# Patient Record
Sex: Male | Born: 1935 | Race: White | Hispanic: No | Marital: Married | Smoking: Current every day smoker
Health system: Southern US, Community
[De-identification: ages and names within clinical notes are randomized; demographics above are authoritative.]

## PROBLEM LIST (undated history)

## (undated) HISTORY — PX: HERNIA REPAIR: SHX51

---

## 1976-12-04 DIAGNOSIS — I639 Cerebral infarction, unspecified: Secondary | ICD-10-CM

## 1976-12-04 HISTORY — DX: Cerebral infarction, unspecified: I63.9

## 2005-10-09 ENCOUNTER — Ambulatory Visit: Payer: Self-pay | Admitting: Unknown Physician Specialty

## 2006-04-09 ENCOUNTER — Ambulatory Visit: Payer: Self-pay | Admitting: Unknown Physician Specialty

## 2007-05-28 ENCOUNTER — Ambulatory Visit: Payer: Self-pay | Admitting: Family Medicine

## 2007-09-17 ENCOUNTER — Ambulatory Visit: Payer: Self-pay | Admitting: Ophthalmology

## 2009-07-20 ENCOUNTER — Ambulatory Visit: Payer: Self-pay | Admitting: Unknown Physician Specialty

## 2011-02-08 ENCOUNTER — Ambulatory Visit: Payer: Self-pay | Admitting: Ophthalmology

## 2011-02-20 ENCOUNTER — Ambulatory Visit: Payer: Self-pay | Admitting: Ophthalmology

## 2012-04-15 ENCOUNTER — Ambulatory Visit: Payer: Self-pay | Admitting: Dermatology

## 2012-04-15 LAB — CBC WITH DIFFERENTIAL/PLATELET
Basophil #: 0.1 10*3/uL (ref 0.0–0.1)
Eosinophil #: 0.3 10*3/uL (ref 0.0–0.7)
Eosinophil %: 3.9 %
HCT: 36.9 % — ABNORMAL LOW (ref 40.0–52.0)
HGB: 12.8 g/dL — ABNORMAL LOW (ref 13.0–18.0)
Lymphocyte #: 2 10*3/uL (ref 1.0–3.6)
Lymphocyte %: 29.8 %
MCH: 33.3 pg (ref 26.0–34.0)
MCHC: 34.6 g/dL (ref 32.0–36.0)
MCV: 97 fL (ref 80–100)
Monocyte #: 0.5 x10 3/mm (ref 0.2–1.0)
Neutrophil %: 58.1 %
RBC: 3.83 10*6/uL — ABNORMAL LOW (ref 4.40–5.90)
WBC: 6.6 10*3/uL (ref 3.8–10.6)

## 2012-06-19 ENCOUNTER — Ambulatory Visit: Payer: Self-pay | Admitting: Family Medicine

## 2013-06-12 ENCOUNTER — Ambulatory Visit: Payer: Self-pay | Admitting: Family Medicine

## 2013-08-20 ENCOUNTER — Inpatient Hospital Stay: Payer: Self-pay | Admitting: Surgery

## 2013-08-20 LAB — COMPREHENSIVE METABOLIC PANEL
Alkaline Phosphatase: 109 U/L (ref 50–136)
BUN: 19 mg/dL — ABNORMAL HIGH (ref 7–18)
Calcium, Total: 9.5 mg/dL (ref 8.5–10.1)
Chloride: 105 mmol/L (ref 98–107)
Co2: 27 mmol/L (ref 21–32)
Creatinine: 1.13 mg/dL (ref 0.60–1.30)
EGFR (African American): >60
Glucose: 178 mg/dL — ABNORMAL HIGH (ref 65–99)
Osmolality: 279 (ref 275–301)
Potassium: 4.2 mmol/L (ref 3.5–5.1)
Total Protein: 7.7 g/dL (ref 6.4–8.2)

## 2013-08-20 LAB — URINALYSIS, COMPLETE
Bacteria: NONE SEEN
Bilirubin,UR: NEGATIVE
Leukocyte Esterase: NEGATIVE
Squamous Epithelial: NONE SEEN
WBC UR: 1 /HPF (ref 0–5)

## 2013-08-20 LAB — CBC
MCH: 32.6 pg (ref 26.0–34.0)
MCHC: 34.3 g/dL (ref 32.0–36.0)
MCV: 95 fL (ref 80–100)
RBC: 4.26 10*6/uL — ABNORMAL LOW (ref 4.40–5.90)
WBC: 14.2 10*3/uL — ABNORMAL HIGH (ref 3.8–10.6)

## 2013-08-20 LAB — LIPASE, BLOOD: Lipase: 74 U/L (ref 73–393)

## 2013-08-22 LAB — PATHOLOGY REPORT

## 2015-03-26 NOTE — H&P (Signed)
PATIENT NAME:  Alex Mclaughlin, Alex Mclaughlin MR#:  [id number] DATE OF BIRTH:  October 11, 1925  DATE OF ADMISSION:  08/20/2013  PRIMARY CARE PHYSICIAN:  Dr. Burnett ShengHedrick  ADMITTING PHYSICIAN:  Dr. Michela PitcherEly  CHIEF COMPLAINT:  Abdominal pain.   BRIEF HISTORY:  Alex Mclaughlin is an 81 year old gentleman seen in the Emergency Room with intermittent abdominal pain over the last several days with increasing symptoms over the last 12 hours. He thought he was having an episode of diverticulitis. He has significant left lower quadrant suprapubic abdominal tenderness. He vomited several times this morning.  He had 1  episode of diarrhea. He presented to the Emergency Room for further evaluation. He was noted to have a slightly elevated white blood cell count. CT scan was performed which demonstrated what appears to be an incarcerated left inguinal hernia with bowel obstruction. There was some enhancement around the bowel consistent with possible strangulation. The surgical service was consulted.   He has had a known left inguinal hernia with some discomfort in that area before, but it has always been reducible. He elected not to proceed with a nonurgent repair. He has had a history of diverticulosis. He had a colonoscopy which did demonstrate significant diverticulosis. He has had no history of hepatitis, yellow jaundice, pancreatitis, peptic ulcer disease or gallbladder disease. There is no previous abdominal surgery. He has no history of cardiac disease, hypertension, diabetes or thyroid problems. He is a long-standing cigarette smoker.   He takes no medications regularly and has no medical allergies.   His son relates he has been having some problem with stool incontinence recently and wonders if these symptoms are related to this current problem.   REVIEW OF SYSTEMS: Otherwise unremarkable other than some mild urinary urgency and difficulty voiding.   FAMILY HISTORY: Noncontributory.   PHYSICAL EXAMINATION: VITAL SIGNS: Blood  pressure 128/64, heart rate is 92 and regular. He is afebrile.  HEENT: Reveals no scleral icterus. No pupillary abnormalities. No facial deformities. He is very hard of hearing. NECK: Supple, nontender,  midline trachea and no adenopathy.  CHEST: Clear with very distant breath sounds. He has normal pulmonary excursion.  CARDIAC: No murmurs or gallops to my ear and seems to be in normal sinus rhythm.  ABDOMEN: Flat with the exception of a large left inguinal hernia which cannot be reduced and is moderately tender. He has active bowel sounds. No other masses are noted. He has no abdominal scar. EXTREMITIES:  Upper and lower extremity exam reveals full range of motion. No deformities and moderate distal pulses.  PSYCHIATRIC: Normal affect, reasonable orientation. Again, very hard of hearing.   Independently reviewed his CT scan. He does appear to have significant obstruction in the left lower quadrant likely related to his incarcerated hernia. White blood cell count is 14,000. Electrolytes are largely remarkable.   ASSESSMENT AND PLAN: This gentleman appears to have incarcerated left inguinal hernia with evidence for incarceration and possible strangulation. His lactic acid is 2.2. In this setting we would recommend urgent surgical intervention. This plan has been discussed with the patient in detail, and he is in agreement. The son is available for the interview.     ____________________________ Quentin Orealph L. Ely III, MD rle:dmm Mclaughlin: 08/20/2013 20:04:59 ET T: 08/20/2013 20:33:08 ET JOB#: [id number]  cc: Carmie Endalph L. Ely III, MD, <Dictator> Rhona LeavensJames F. Burnett ShengHedrick, MD Quentin OreALPH L ELY MD ELECTRONICALLY SIGNED 08/21/2013 0:06

## 2015-03-26 NOTE — Op Note (Signed)
PATIENT NAME:  Alex McmurrayROUTMAN, Ramesses D MR#:  [id number] DATE OF BIRTH:  September 26, 1925  DATE OF PROCEDURE:  08/20/2013  PREOPERATIVE DIAGNOSIS:  Incarcerated left inguinal hernia.  POSTOPERATIVE DIAGNOSIS:  Incarcerated left inguinal hernia.   PROCEDURE PERFORMED:  Left inguinal hernia repair.   ANESTHESIA:  General.  SURGEON:  Quentin Orealph L. Ely, M.D.   OPERATIVE PROCEDURE:  With the patient in supine position after the induction of appropriate general anesthesia, the patient's abdomen was prepped with ChloraPrep and draped with sterile towels.  An incision was made over the left lower quadrant, parallel to the inguinal ligament and carried down through the subcutaneous tissue with Bovie electrocautery.  External oblique fascia was identified and appeared to separate over the sac itself.  There was a large sac with incarcerated loop of bowel obviously visible.  The bowel could not be well-visualized as to ascertain its viability.  The sac was manipulated off the inguinal floor and separated from the cord structures.  In doing so the bowel spontaneously reduced.  The sac was dissected back to its base in the internal ring.  The sac was opened.  There was no bowel contents in the sac.  I was able to put a finger into the peritoneal cavity and could not identify any loop of bowel.  I did not see the piece of bowel that had been incarcerated so the viability of the bowel was still in question.  The sac was suture-ligated at its base and divided.  The sac was dissected back to the internal ring.  The conjoined tendon was then sutured to Poupart's ligament using 2-0 Surgilon.  A relaxing  incision was made.  The external oblique was then closed over the sac using 2-0 Surgilon without difficulty.  The area was infiltrated with 0.25% Marcaine for postoperative pain control.  Scarpa's fascia was reapproximated with a running suture of 3-0 Vicryl and the skin was clipped.  Sterile dressings applied.  The patient was returned to  the recovery room, having tolerated the procedure well.  Sponge, instrument and needle counts were correct x 2 in the Operating Room.     ____________________________ Quentin Orealph L. Ely III, MD rle:ea D: 08/20/2013 21:53:23 ET T: 08/21/2013 01:59:22 ET JOB#: [id number]  cc: Carmie Endalph L. Ely III, MD, <Dictator> Barbette ReichmannVishwanath Hande, MD Quentin OreALPH L ELY MD ELECTRONICALLY SIGNED 08/25/2013 78:2920:04

## 2015-12-05 DIAGNOSIS — Z8719 Personal history of other diseases of the digestive system: Secondary | ICD-10-CM

## 2015-12-05 DIAGNOSIS — Z9889 Other specified postprocedural states: Secondary | ICD-10-CM

## 2015-12-05 HISTORY — DX: Other specified postprocedural states: Z98.890

## 2015-12-05 HISTORY — DX: Personal history of other diseases of the digestive system: Z87.19

## 2016-02-03 ENCOUNTER — Other Ambulatory Visit: Payer: Self-pay | Admitting: Nurse Practitioner

## 2016-02-03 DIAGNOSIS — R1310 Dysphagia, unspecified: Secondary | ICD-10-CM

## 2016-02-03 DIAGNOSIS — R142 Eructation: Secondary | ICD-10-CM

## 2016-02-11 ENCOUNTER — Ambulatory Visit
Admission: RE | Admit: 2016-02-11 | Discharge: 2016-02-11 | Disposition: A | Discharge status: Home / Self Care | Payer: Medicare Other | Source: Ambulatory Visit | Attending: Nurse Practitioner | Admitting: Nurse Practitioner

## 2016-02-11 ENCOUNTER — Other Ambulatory Visit: Payer: Self-pay | Admitting: Nurse Practitioner

## 2016-02-11 DIAGNOSIS — R1310 Dysphagia, unspecified: Secondary | ICD-10-CM | POA: Insufficient documentation

## 2016-02-11 DIAGNOSIS — M2578 Osteophyte, vertebrae: Secondary | ICD-10-CM | POA: Diagnosis not present

## 2016-02-11 DIAGNOSIS — R062 Wheezing: Secondary | ICD-10-CM

## 2016-02-11 DIAGNOSIS — R142 Eructation: Secondary | ICD-10-CM

## 2016-02-16 ENCOUNTER — Other Ambulatory Visit: Payer: Self-pay | Admitting: Family Medicine

## 2016-02-16 DIAGNOSIS — R911 Solitary pulmonary nodule: Secondary | ICD-10-CM

## 2016-02-24 ENCOUNTER — Ambulatory Visit
Admission: RE | Admit: 2016-02-24 | Discharge: 2016-02-24 | Disposition: A | Discharge status: Home / Self Care | Payer: Medicare Other | Source: Ambulatory Visit | Attending: Family Medicine | Admitting: Family Medicine

## 2016-02-24 DIAGNOSIS — I2510 Atherosclerotic heart disease of native coronary artery without angina pectoris: Secondary | ICD-10-CM | POA: Insufficient documentation

## 2016-02-24 DIAGNOSIS — R911 Solitary pulmonary nodule: Secondary | ICD-10-CM

## 2016-02-24 DIAGNOSIS — R918 Other nonspecific abnormal finding of lung field: Secondary | ICD-10-CM | POA: Diagnosis not present

## 2017-09-07 ENCOUNTER — Emergency Department
Admission: EM | Admit: 2017-09-07 | Discharge: 2017-09-07 | Disposition: A | Discharge status: Home / Self Care | Payer: Medicare Other | Attending: Emergency Medicine | Admitting: Emergency Medicine

## 2017-09-07 ENCOUNTER — Emergency Department: Payer: Medicare Other

## 2017-09-07 DIAGNOSIS — Y9301 Activity, walking, marching and hiking: Secondary | ICD-10-CM | POA: Insufficient documentation

## 2017-09-07 DIAGNOSIS — Y999 Unspecified external cause status: Secondary | ICD-10-CM | POA: Diagnosis not present

## 2017-09-07 DIAGNOSIS — W19XXXA Unspecified fall, initial encounter: Secondary | ICD-10-CM

## 2017-09-07 DIAGNOSIS — Y92009 Unspecified place in unspecified non-institutional (private) residence as the place of occurrence of the external cause: Secondary | ICD-10-CM

## 2017-09-07 DIAGNOSIS — W0110XA Fall on same level from slipping, tripping and stumbling with subsequent striking against unspecified object, initial encounter: Secondary | ICD-10-CM | POA: Diagnosis not present

## 2017-09-07 DIAGNOSIS — S0181XA Laceration without foreign body of other part of head, initial encounter: Secondary | ICD-10-CM | POA: Diagnosis not present

## 2017-09-07 DIAGNOSIS — Y929 Unspecified place or not applicable: Secondary | ICD-10-CM | POA: Insufficient documentation

## 2017-09-07 DIAGNOSIS — S0081XA Abrasion of other part of head, initial encounter: Secondary | ICD-10-CM

## 2017-09-07 DIAGNOSIS — S0990XA Unspecified injury of head, initial encounter: Secondary | ICD-10-CM | POA: Diagnosis present

## 2017-09-07 MED ORDER — BACITRACIN ZINC 500 UNIT/GM EX OINT
TOPICAL_OINTMENT | Freq: Once | CUTANEOUS | Status: AC
  Administered 2017-09-07: 1 via TOPICAL
  Filled 2017-09-07: qty 0.9

## 2017-09-07 NOTE — ED Provider Notes (Signed)
[facility] Emergency Department Provider Note ____________________________________________  Time seen: 2133  I have reviewed the triage vital signs and the nursing notes.  HISTORY  Chief Complaint  Fall  HPI Alex Mclaughlin is a 83 y.o. male Presents to the ED by family member, for evaluation of injury sustained after he tripped at home. The patient apparently was walking along the asphalt, and when he went under her rope, he landed hitting his face on the asphalt. He sustained an abrasion to his nose and forehead, as well as a laceration to his left cheek. Bleeding is currently controlled. Patient denies any loss of consciousness, nausea, vomiting, dizziness, or headache. He denies any particular pain related to his injuries. He does not take any blood thinners or daily aspirin.  No past medical history on file.  There are no active problems to display for this patient.   No past surgical history on file.  Prior to Admission medications   Not on File    Allergies Patient has no known allergies.  No family history on file.  Social History Social History  Substance Use Topics  . Smoking status: Not on file  . Smokeless tobacco: Not on file  . Alcohol use Not on file    Review of Systems  Constitutional: Negative for fever. Eyes: Negative for visual changes. ENT: Negative for sore throat. Cardiovascular: Negative for chest pain. Respiratory: Negative for shortness of breath. Gastrointestinal: Negative for abdominal pain, vomiting and diarrhea. Musculoskeletal: Negative for back pain. Skin: Negative for rash. Facial abrasion and lacerations as above. Neurological: Negative for headaches, focal weakness or numbness. ____________________________________________  PHYSICAL EXAM:  VITAL SIGNS: ED Triage Vitals  Enc Vitals Group     BP 09/07/17 1946 (!) 137/117     Pulse Rate 09/07/17 1946 79     Resp 09/07/17 1946 14     Temp 09/07/17 1946  98.3 F (36.8 C)     Temp Source 09/07/17 1946 Oral     SpO2 09/07/17 1946 97 %     Weight 09/07/17 1946 138 lb (62.6 kg)     Height 09/07/17 1946  (1.727 m)     Head Circumference --      Peak Flow --      Pain Score 09/07/17 1954 0     Pain Loc --      Pain Edu? --      Excl. in GC? --     Constitutional: Alert and oriented. Well appearing and in no distress. Head: Normocephalic and atraumatic, except for superficial abrasions to the nasal bridge and the left forehead over the brow. Patient also has a linear laceration consistent with the edge of his glasses, to the left cheek. Eyes: Conjunctivae are normal. PERRL. Normal extraocular movements Nose: No congestion/rhinorrhea/epistaxis. Left nasal ala resection due to Mohs surgery.  Mouth/Throat: Mucous membranes are moist. Neck: Supple. No thyromegaly. Cardiovascular: Normal rate, regular rhythm. Normal distal pulses. Respiratory: Normal respiratory effort. No wheezes/rales/rhonchi. Musculoskeletal: Nontender with normal range of motion in all extremities.  Neurologic:  Normal gait without ataxia. Normal speech and language. No gross focal neurologic deficits are appreciated. Skin:  Skin is warm, dry and intact. No rash noted. Psychiatric: Mood and affect are normal. Patient exhibits appropriate insight and judgment. ____________________________________________   RADIOLOGY  CT Head & Maxillofacial   IMPRESSION: Marked atrophy with small vessel chronic ischemic changes of deep cerebral white matter.  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  No acute intracranial abnormalities.  No acute facial bone abnormalities. ____________________________________________  PROCEDURES  Bacitracin ointment to the abrasions.   LACERATION REPAIR Performed by: Lissa Hoard Authorized by: Lissa Hoard Consent: Verbal consent obtained. Risks and benefits: risks,  benefits and alternatives were discussed Consent given by: patient Patient identity confirmed: provided demographic data Prepped and Draped in normal sterile fashion Wound explored  Laceration Location: left cheek  Laceration Length: 4cm  No Foreign Bodies seen or palpated  Anesthesia: none  Irrigation method: saline + gauze  Amount of cleaning: standard  Skin closure: Dermabond   Patient tolerance: Patient tolerated the procedure well with no immediate complications. ____________________________________________  INITIAL IMPRESSION / ASSESSMENT AND PLAN / ED COURSE  Geriatric patient with ED evaluation of injury sustained following a mechanical fal at home. There was no reported loss of consciousness or other muscular skeletal injury. The patient's exam is benign, and his head and facial CTs are negative for any acute fracture or intracranial processes. His wounds are cleansed with saline and antibiotic ointment is applied. The laceration to the left cheek is repaired using wound adhesive. Patient is discharged with wound care instructions to the care of his family member. Return precautions provided.  ____________________________________________  FINAL CLINICAL IMPRESSION(S) / ED DIAGNOSES  Final diagnoses:  Fall in home, initial encounter  Face lacerations, initial encounter  Facial abrasion, initial encounter      Lissa Hoard, PA-C 09/07/17 2220    Myrna Blazer, MD 09/08/17 [phone]

## 2017-09-07 NOTE — ED Triage Notes (Signed)
Pt ambulatory to triage with no difficulty. Pt reports he tripped going under a rope on his porch and landed on asphalt. Pt has an abrasion to his left forehead and a cut under his left eye with bleeding controlled at this time. Pt denies LOC. Pt reports he takes no medications except aleve.

## 2017-09-07 NOTE — ED Notes (Signed)
Spoke with Joseph Art, PA-C about pt and orders received.

## 2017-09-07 NOTE — Discharge Instructions (Signed)
Your exam and CT scans were normal following your fall. Your cheek laceration has been repaired using wound glue. Keep the wounds clean and dry. You may place a small amount of ointment on the nose and brow. DO NOT put any ointment or petroleum jelly on the glued cheek laceration. See Dr. Burnett Sheng as needed. Take Tylenol for pain, as needed.

## 2018-02-26 ENCOUNTER — Other Ambulatory Visit: Payer: Self-pay | Admitting: Orthopedic Surgery

## 2018-02-26 DIAGNOSIS — S22000A Wedge compression fracture of unspecified thoracic vertebra, initial encounter for closed fracture: Secondary | ICD-10-CM

## 2018-03-08 ENCOUNTER — Ambulatory Visit
Admission: RE | Admit: 2018-03-08 | Discharge: 2018-03-08 | Disposition: A | Discharge status: Home / Self Care | Payer: Medicare Other | Source: Ambulatory Visit | Attending: Orthopedic Surgery | Admitting: Orthopedic Surgery

## 2018-03-08 DIAGNOSIS — S22070A Wedge compression fracture of T9-T10 vertebra, initial encounter for closed fracture: Secondary | ICD-10-CM | POA: Diagnosis not present

## 2018-03-08 DIAGNOSIS — X58XXXA Exposure to other specified factors, initial encounter: Secondary | ICD-10-CM | POA: Diagnosis not present

## 2018-03-08 DIAGNOSIS — S22000A Wedge compression fracture of unspecified thoracic vertebra, initial encounter for closed fracture: Secondary | ICD-10-CM

## 2021-04-20 ENCOUNTER — Emergency Department: Payer: Medicare Other

## 2021-04-20 ENCOUNTER — Inpatient Hospital Stay
Admission: EM | Admit: 2021-04-20 | Discharge: 2021-04-25 | DRG: 536 | Disposition: A | Discharge status: Home Health | Payer: Medicare Other | Attending: Internal Medicine | Admitting: Internal Medicine

## 2021-04-20 ENCOUNTER — Other Ambulatory Visit: Payer: Self-pay

## 2021-04-20 DIAGNOSIS — S72141A Displaced intertrochanteric fracture of right femur, initial encounter for closed fracture: Secondary | ICD-10-CM

## 2021-04-20 DIAGNOSIS — D509 Iron deficiency anemia, unspecified: Secondary | ICD-10-CM | POA: Diagnosis present

## 2021-04-20 DIAGNOSIS — Z20822 Contact with and (suspected) exposure to covid-19: Secondary | ICD-10-CM | POA: Diagnosis present

## 2021-04-20 DIAGNOSIS — D539 Nutritional anemia, unspecified: Secondary | ICD-10-CM | POA: Insufficient documentation

## 2021-04-20 DIAGNOSIS — H9190 Unspecified hearing loss, unspecified ear: Secondary | ICD-10-CM | POA: Diagnosis not present

## 2021-04-20 DIAGNOSIS — R2681 Unsteadiness on feet: Secondary | ICD-10-CM | POA: Diagnosis present

## 2021-04-20 DIAGNOSIS — W19XXXA Unspecified fall, initial encounter: Secondary | ICD-10-CM

## 2021-04-20 DIAGNOSIS — S72111A Displaced fracture of greater trochanter of right femur, initial encounter for closed fracture: Secondary | ICD-10-CM | POA: Diagnosis not present

## 2021-04-20 DIAGNOSIS — W1830XA Fall on same level, unspecified, initial encounter: Secondary | ICD-10-CM | POA: Diagnosis present

## 2021-04-20 DIAGNOSIS — D519 Vitamin B12 deficiency anemia, unspecified: Secondary | ICD-10-CM | POA: Diagnosis present

## 2021-04-20 DIAGNOSIS — E876 Hypokalemia: Secondary | ICD-10-CM | POA: Diagnosis present

## 2021-04-20 DIAGNOSIS — Z8673 Personal history of transient ischemic attack (TIA), and cerebral infarction without residual deficits: Secondary | ICD-10-CM

## 2021-04-20 DIAGNOSIS — S72001A Fracture of unspecified part of neck of right femur, initial encounter for closed fracture: Secondary | ICD-10-CM | POA: Diagnosis present

## 2021-04-20 DIAGNOSIS — R52 Pain, unspecified: Secondary | ICD-10-CM

## 2021-04-20 DIAGNOSIS — J449 Chronic obstructive pulmonary disease, unspecified: Secondary | ICD-10-CM | POA: Diagnosis present

## 2021-04-20 DIAGNOSIS — K219 Gastro-esophageal reflux disease without esophagitis: Secondary | ICD-10-CM | POA: Diagnosis present

## 2021-04-20 DIAGNOSIS — I5032 Chronic diastolic (congestive) heart failure: Secondary | ICD-10-CM | POA: Diagnosis present

## 2021-04-20 DIAGNOSIS — E785 Hyperlipidemia, unspecified: Secondary | ICD-10-CM | POA: Diagnosis present

## 2021-04-20 DIAGNOSIS — Y92009 Unspecified place in unspecified non-institutional (private) residence as the place of occurrence of the external cause: Secondary | ICD-10-CM

## 2021-04-20 LAB — CBC WITH DIFFERENTIAL/PLATELET
Abs Immature Granulocytes: 0.03 10*3/uL (ref 0.00–0.07)
Basophils Absolute: 0.1 10*3/uL (ref 0.0–0.1)
Basophils Relative: 1 %
Eosinophils Absolute: 0.2 10*3/uL (ref 0.0–0.5)
Eosinophils Relative: 2 %
HCT: 33.8 % — ABNORMAL LOW (ref 39.0–52.0)
Hemoglobin: 11.2 g/dL — ABNORMAL LOW (ref 13.0–17.0)
Immature Granulocytes: 0 %
Lymphocytes Relative: 18 %
Lymphs Abs: 1.6 10*3/uL (ref 0.7–4.0)
MCH: 33.3 pg (ref 26.0–34.0)
MCHC: 33.1 g/dL (ref 30.0–36.0)
MCV: 100.6 fL — ABNORMAL HIGH (ref 80.0–100.0)
Monocytes Absolute: 0.5 10*3/uL (ref 0.1–1.0)
Monocytes Relative: 6 %
Neutro Abs: 6.6 10*3/uL (ref 1.7–7.7)
Neutrophils Relative %: 73 %
Platelets: 217 10*3/uL (ref 150–400)
RBC: 3.36 MIL/uL — ABNORMAL LOW (ref 4.22–5.81)
RDW: 14.6 % (ref 11.5–15.5)
WBC: 9 10*3/uL (ref 4.0–10.5)
nRBC: 0 % (ref 0.0–0.2)

## 2021-04-20 LAB — URINALYSIS, COMPLETE (UACMP) WITH MICROSCOPIC
Bacteria, UA: NONE SEEN
Bilirubin Urine: NEGATIVE
Glucose, UA: NEGATIVE mg/dL
Hgb urine dipstick: NEGATIVE
Ketones, ur: NEGATIVE mg/dL
Leukocytes,Ua: NEGATIVE
Nitrite: NEGATIVE
Protein, ur: 30 mg/dL — AB
Specific Gravity, Urine: 1.025 (ref 1.005–1.030)
pH: 5 (ref 5.0–8.0)

## 2021-04-20 LAB — COMPREHENSIVE METABOLIC PANEL
ALT: 8 U/L (ref 0–44)
AST: 16 U/L (ref 15–41)
Albumin: 3.5 g/dL (ref 3.5–5.0)
Alkaline Phosphatase: 85 U/L (ref 38–126)
Anion gap: 12 (ref 5–15)
BUN: 17 mg/dL (ref 8–23)
CO2: 23 mmol/L (ref 22–32)
Calcium: 8.5 mg/dL — ABNORMAL LOW (ref 8.9–10.3)
Chloride: 101 mmol/L (ref 98–111)
Creatinine, Ser: 1.08 mg/dL (ref 0.61–1.24)
GFR, Estimated: >60 mL/min (ref >60)
Glucose, Bld: 281 mg/dL — ABNORMAL HIGH (ref 70–99)
Potassium: 3.9 mmol/L (ref 3.5–5.1)
Sodium: 136 mmol/L (ref 135–145)
Total Bilirubin: 0.5 mg/dL (ref 0.3–1.2)
Total Protein: 7 g/dL (ref 6.5–8.1)

## 2021-04-20 LAB — RESP PANEL BY RT-PCR (FLU A&B, COVID) ARPGX2
Influenza A by PCR: NEGATIVE
Influenza B by PCR: NEGATIVE
SARS Coronavirus 2 by RT PCR: NEGATIVE

## 2021-04-20 LAB — TROPONIN I (HIGH SENSITIVITY): Troponin I (High Sensitivity): 5 ng/L (ref <18)

## 2021-04-20 MED ORDER — HYDROMORPHONE HCL 1 MG/ML IJ SOLN
0.5000 mg | INTRAMUSCULAR | Status: DC | PRN
  Administered 2021-04-21 – 2021-04-22 (×3): 0.5 mg via INTRAVENOUS
  Filled 2021-04-20 (×3): qty 1

## 2021-04-20 MED ORDER — ACETAMINOPHEN 500 MG PO TABS
1000.0000 mg | ORAL_TABLET | Freq: Once | ORAL | Status: AC
  Administered 2021-04-20: 1000 mg via ORAL

## 2021-04-20 MED ORDER — POLYETHYLENE GLYCOL 3350 17 G PO PACK: 17.0000 g | PACK | Freq: Every day | ORAL | Status: DC | PRN

## 2021-04-20 MED ORDER — HYDROCODONE-ACETAMINOPHEN 5-325 MG PO TABS
1.0000 | ORAL_TABLET | Freq: Four times a day (QID) | ORAL | Status: DC | PRN
  Administered 2021-04-21: 1 via ORAL
  Filled 2021-04-20: qty 1

## 2021-04-20 MED ORDER — FENTANYL CITRATE (PF) 100 MCG/2ML IJ SOLN
12.5000 ug | Freq: Once | INTRAMUSCULAR | Status: AC
  Administered 2021-04-20: 12.5 ug via INTRAVENOUS
  Filled 2021-04-20: qty 2

## 2021-04-20 NOTE — ED Triage Notes (Addendum)
Pt presents to ER via ems from home.  Per ems, pt was walking to bathroom at home when he fell around 1 hr ago.  Pt has reportedly been having more unsteady gait recently. Ems states pt has also been more weak than normal per pt's son.  Pt A&O x3 at this time. Pt is c/o some left sided hip pain at this time. Pt is also very hard of hearing.

## 2021-04-20 NOTE — H&P (Addendum)
History and Physical   Alex Mclaughlin PQZ:[id number] DOB: 11-Dec-1924 DOA: 04/20/2021  PCP: Jerl Mina, MD   Patient coming from: Home  Chief Complaint: Fall  HPI: Alex Mclaughlin is a 87 y.o. male with medical history significant of cognitive decline, COPD, diverticulosis, esophageal stricture, GERD, hyperlipidemia, renal calculi, psoriasis, skin cancer, spinal stenosis, CVA, hard of hearing who presents after a fall at home.  Some history obtained with assistance family as patient is very hard of hearing. Patient had a fall when out to dinner, after stepping on the sidewalk.  Family reports he has had more weakness recently and a more unsteady gait. He has had some decline since his wife passed away earlier this year.  He does still live at home.  He does have help at home already with 24 hour care per son with a person during the day and night (they do not appear to formally trained).  He was recently seen in March due to some cognitive decline and performed poorly on initial test per chart review.  He reports some moderate ongoing hip pain.  Denies fevers, chest pain, SOB, Nausea, Vomiting, Abdominal pain, Diarrhea, constipation.  He currently takes only Tylenol.  ED Course: Vital signs in the ED stable, blood pressure in the 100s to 110s systolic.  Lab work-up showed CMP with glucose 281 and calcium 8.5.  CBC showed hemoglobin of 11.2 and MCV of 100.  Initial troponin normal with repeat pending.  Respiratory panel for flu and COVID negative.  Urinalysis normal except for some protein.  Imaging work-up included chest x-ray showing only mild vascular congestion, CT head with no acute changes and stable chronic changes, CT C-spine with no acute changes, right hip x-ray showing right intratrochanteric hip fracture, and left hip x-ray with no acute abnormality.  Patient received dose of fentanyl and Tylenol in the ED and Ortho was consulted who recommended n.p.o. at midnight.  Review of Systems:  As per HPI otherwise all other systems reviewed and are negative.  History reviewed. No pertinent past medical history.  History reviewed. No pertinent surgical history.  Has had colonoscopy  Social History  has no history on file for tobacco use, alcohol use, and drug use.  No Known Allergies  Family History  Problem Relation Age of Onset  . Stomach cancer Mother   . Stomach cancer Father   Reviewed on admission  Prior to Admission medications   Not on File  Tylenol  Physical Exam: Vitals:   04/20/21 2050 04/20/21 2052  BP: 103/79   Pulse: 90   Resp: (!) 21   Temp: 97.8 F (36.6 C)   TempSrc: Oral   SpO2: 96%   Weight:  58.1 kg  Height:  5\' 8"  (1.727 m)   Physical Exam Constitutional:      General: He is not in acute distress.    Appearance: Normal appearance.     Comments: Elderly male, hard of hearing  HENT:     Head: Normocephalic and atraumatic.     Mouth/Throat:     Mouth: Mucous membranes are moist.     Pharynx: Oropharynx is clear.  Eyes:     Extraocular Movements: Extraocular movements intact.     Pupils: Pupils are equal, round, and reactive to light.  Cardiovascular:     Rate and Rhythm: Normal rate and regular rhythm.     Pulses: Normal pulses.     Heart sounds: Normal heart sounds.  Pulmonary:     Effort: Pulmonary effort  is normal. No respiratory distress.     Breath sounds: Normal breath sounds.  Abdominal:     General: Bowel sounds are normal. There is no distension.     Palpations: Abdomen is soft.     Tenderness: There is no abdominal tenderness.  Musculoskeletal:        General: No swelling or deformity.     Comments: Bilateral lower extremities neurovascular intact  Skin:    General: Skin is warm and dry.  Neurological:     General: No focal deficit present.     Mental Status: Mental status is at baseline.    Labs on Admission: I have personally reviewed following labs and imaging studies  CBC: Recent Labs  Lab  04/20/21 2054  WBC 9.0  NEUTROABS 6.6  HGB 11.2*  HCT 33.8*  MCV 100.6*  PLT 217    Basic Metabolic Panel: Recent Labs  Lab 04/20/21 2054  NA 136  K 3.9  CL 101  CO2 23  GLUCOSE 281*  BUN 17  CREATININE 1.08  CALCIUM 8.5*    GFR: Estimated Creatinine Clearance: 34.4 mL/min (by C-G formula based on SCr of 1.08 mg/dL).  Liver Function Tests: Recent Labs  Lab 04/20/21 2054  AST 16  ALT 8  ALKPHOS 85  BILITOT 0.5  PROT 7.0  ALBUMIN 3.5    Urine analysis:    Component Value Date/Time   COLORURINE YELLOW (A) 04/20/2021 2054   APPEARANCEUR HAZY (A) 04/20/2021 2054   APPEARANCEUR Clear 08/20/2013 1422   LABSPEC 1.025 04/20/2021 2054   LABSPEC 1.030 08/20/2013 1422   PHURINE 5.0 04/20/2021 2054   GLUCOSEU NEGATIVE 04/20/2021 2054   GLUCOSEU Negative 08/20/2013 1422   HGBUR NEGATIVE 04/20/2021 2054   BILIRUBINUR NEGATIVE 04/20/2021 2054   BILIRUBINUR Negative 08/20/2013 1422   KETONESUR NEGATIVE 04/20/2021 2054   PROTEINUR 30 (A) 04/20/2021 2054   NITRITE NEGATIVE 04/20/2021 2054   LEUKOCYTESUR NEGATIVE 04/20/2021 2054   LEUKOCYTESUR Negative 08/20/2013 1422    Radiological Exams on Admission: CT Head Wo Contrast  Result Date: 04/20/2021 CLINICAL DATA:  Fall EXAM: CT HEAD WITHOUT CONTRAST TECHNIQUE: Contiguous axial images were obtained from the base of the skull through the vertex without intravenous contrast. COMPARISON:  None. FINDINGS: Brain: Right temporal and parietal encephalomalacia is unchanged. Multiple old small vessel infarcts. Generalized atrophy. There is periventricular hypoattenuation compatible with chronic microvascular disease. No acute hemorrhage. Vascular: No abnormal hyperdensity of the major intracranial arteries or dural venous sinuses. No intracranial atherosclerosis. Skull: The visualized skull base, calvarium and extracranial soft tissues are normal. Sinuses/Orbits: No fluid levels or advanced mucosal thickening of the visualized  paranasal sinuses. No mastoid or middle ear effusion. The orbits are normal. IMPRESSION: 1. No acute intracranial abnormality. 2. Unchanged right temporal and parietal encephalomalacia and multiple old small vessel infarcts. Electronically Signed   By: Deatra Robinson M.D.   On: 04/20/2021 22:26   CT Cervical Spine Wo Contrast  Result Date: 04/20/2021 CLINICAL DATA:  Fall EXAM: CT CERVICAL SPINE WITHOUT CONTRAST TECHNIQUE: Multidetector CT imaging of the cervical spine was performed without intravenous contrast. Multiplanar CT image reconstructions were also generated. COMPARISON:  None. FINDINGS: Alignment: No static subluxation. Facets are aligned. Occipital condyles and the lateral masses of C1 and C2 are normally approximated. Skull base and vertebrae: No acute fracture. Soft tissues and spinal canal: No prevertebral fluid or swelling. No visible canal hematoma. Disc levels: Multilevel degenerative change without high-grade spinal canal stenosis. Upper chest: No pneumothorax, pulmonary nodule or pleural  effusion. Other: Normal visualized paraspinal cervical soft tissues. IMPRESSION: No acute fracture or static subluxation of the cervical spine. Electronically Signed   By: Deatra Robinson M.D.   On: 04/20/2021 22:31   DG Chest Portable 1 View  Result Date: 04/20/2021 CLINICAL DATA:  Weakness EXAM: PORTABLE CHEST 1 VIEW COMPARISON:  02/11/2016 FINDINGS: Cardiac shadow is within normal limits. Previously seen nodular density in the left upper lobe is not well appreciated. Calcification is noted in the left base stable in appearance from the prior exam. Increased vascular congestion and interstitial edema is noted. No bony abnormality is noted. IMPRESSION: Mild changes of CHF. Electronically Signed   By: Alcide Clever M.D.   On: 04/20/2021 21:28   DG HIP UNILAT WITH PELVIS 2-3 VIEWS LEFT  Result Date: 04/20/2021 CLINICAL DATA:  Bilateral hip pain and weakness, initial encounter EXAM: DG HIP (WITH OR WITHOUT  PELVIS) 2V LEFT COMPARISON:  None. FINDINGS: There is no evidence of hip fracture or dislocation. There is no evidence of arthropathy or other focal bone abnormality. IMPRESSION: No acute abnormality noted. Electronically Signed   By: Alcide Clever M.D.   On: 04/20/2021 22:34   DG HIP UNILAT WITH PELVIS 2-3 VIEWS RIGHT  Result Date: 04/20/2021 CLINICAL DATA:  Bilateral hip pain and weakness, no known injury, initial encounter EXAM: DG HIP (WITH OR WITHOUT PELVIS) 3V RIGHT COMPARISON:  None. FINDINGS: Pelvic ring is intact. Degenerative changes of the lumbar spine are seen. Lucency is noted in the greater trochanter consistent with an incomplete intratrochanteric fracture. No other focal abnormality is noted. IMPRESSION: Incomplete intratrochanteric fracture on the right. Electronically Signed   By: Alcide Clever M.D.   On: 04/20/2021 22:34   EKG: Independently reviewed.  Sinus rhythm at 94 bpm.  Wandering baseline.  Some baseline artifact as well.  Assessment/Plan Principal Problem:   Closed intertrochanteric fracture of hip, right, initial encounter [facility]) Active Problems:   Hard of hearing   Microcytic anemia  Hip fracture > Patient had a fall walking to the bathroom today has had some increased weakness and unsteadiness recently. Of note, lost wife in February and has been less active since. > Work-up showed partial right intertrochanteric fracture > Ortho consulted by EDP who will see the patient in the morning and recommend n.p.o. midnight - Appreciate orthopedic surgery recommendations - Monitor on continuous pulse ox on MedSurg floor - As needed Norco for moderate pain and Dilaudid for severe/breakthrough pain - Hip fracture protocol for positioning and supportive care - Holding anticoagulation - Check a.m. labs including PT/INR and type and screen - N.p.o. midnight  Macrocytic anemia > Noted hemoglobin of 11.2 within MCV >100 - We will check folate and B12  Cognitive decline >  Noted at recent PCP visit in March  COPD Diverticulosis Esophageal stricture GERD Hyperlipidemia Renal calculi Psoriasis Spinal stenosis CVA > Has history of the above noted on chart review of his recent PCP visit, not currently on any medications other than Tylenol.  Hard of hearing - Noted  DVT prophylaxis: SCDs  Code Status:   Full for now, encouraged continued discussion, but other brother is HCPOA Family Communication:  Spoke with Alex Mclaughlin, He states his younger brother, Alex Mclaughlin, is the patient's HCPOA, But Alex Mclaughlin was with his father today as Alex Mclaughlin still works.    Disposition Plan:   Patient is from:  Home  Anticipated DC to:  Pending clinical course  Anticipated DC date:  Pending clinical course  Anticipated DC barriers: None  Consults  called:  Orthopedics, Dr. Okey Duprerawford, consulted by EDP Admission status:  Observation, MedSurg with continuous pulse ox  Severity of Illness: The appropriate patient status for this patient is OBSERVATION. Observation status is judged to be reasonable and necessary in order to provide the required intensity of service to ensure the patient's safety. The patient's presenting symptoms, physical exam findings, and initial radiographic and laboratory data in the context of their medical condition is felt to place them at decreased risk for further clinical deterioration. Furthermore, it is anticipated that the patient will be medically stable for discharge from the hospital within 2 midnights of admission. The following factors support the patient status of observation.   " The patient's presenting symptoms include fall. " The physical exam findings include hard of hearing, but otherwise stable. " The initial radiographic and laboratory data are right intertrochanteric hip fracture.  Glucose 281, hemoglobin 11.2 with MCV 100.   Alex FailAlexander B Tosha Belgarde MD [facility]  How to contact the [facility] Attending or Consulting provider 7A - 7P or covering provider  during after hours 7P -7A, for this patient?   1. Check the care team in [facility] and look for a) attending/consulting [facility] provider listed and b) the [facility] team listed 2. Log into [email or web address] and use [facility]'s universal password to access. If you do not have the password, please contact the hospital operator. 3. Locate the [facility] provider you are looking for under [facility] and page to a number that you can be directly reached. 4. If you still have difficulty reaching the provider, please page the [facility] (Director on Call) for the Hospitalists listed on amion for assistance.  04/20/2021, 11:20 PM

## 2021-04-20 NOTE — ED Provider Notes (Signed)
[facility] Emergency Department Provider Note  ____________________________________________   Event Date/Time   First MD Initiated Contact with Patient 04/20/21 2044     (approximate)  I have reviewed the triage vital signs and the nursing notes.   HISTORY  Chief Complaint Fall and Weakness    HPI Alex Mclaughlin is a 87 y.o. male here with fall.  The patient was reportedly outside with his family and had been eating.  He walked into the  bathroom when he fell.  There was an immediate thump and the patient began calling out in pain.  Per family, unlikely that he passed out.  Per report, the patient has been slightly more weak than usual over the last several weeks.  Patient has not had any known fevers or chills.  No recent medication changes.  No other acute changes in health.  He is not on blood thinners.  He currently complains of 3 out of 10 but 10 out of 10 with movement pain of his right hip.  No alleviating factors.  Pain worse with any kind of movement.  Denies any numbness or weakness.       History reviewed. No pertinent past medical history.  Patient Active Problem List   Diagnosis Date Noted  . Closed intertrochanteric fracture of hip, right, initial encounter (HCC) 04/20/2021  . Hard of hearing 04/20/2021  . Microcytic anemia 04/20/2021    History reviewed. No pertinent surgical history.  Prior to Admission medications   Medication Sig Start Date End Date Taking? Authorizing Provider  acetaminophen (TYLENOL) 325 MG tablet Take 650 mg by mouth every 4 (four) hours as needed for pain.   Yes [provider]    Allergies Patient has no known allergies.  Family History  Problem Relation Age of Onset  . Stomach cancer Mother   . Stomach cancer Father     Social History    Review of Systems  Review of Systems  Constitutional: Negative for chills and fever.  HENT: Negative for sore throat.   Respiratory: Negative for  shortness of breath.   Cardiovascular: Negative for chest pain.  Gastrointestinal: Negative for abdominal pain.  Genitourinary: Negative for flank pain.  Musculoskeletal: Positive for arthralgias and gait problem. Negative for neck pain.  Skin: Negative for rash and wound.  Allergic/Immunologic: Negative for immunocompromised state.  Neurological: Negative for weakness and numbness.  Hematological: Does not bruise/bleed easily.  All other systems reviewed and are negative.    ____________________________________________  PHYSICAL EXAM:      VITAL SIGNS: ED Triage Vitals  Enc Vitals Group     BP 04/20/21 2050 103/79     Pulse Rate 04/20/21 2050 90     Resp 04/20/21 2050 (!) 21     Temp 04/20/21 2050 97.8 F (36.6 C)     Temp Source 04/20/21 2050 Oral     SpO2 04/20/21 2050 96 %     Weight 04/20/21 2052 128 lb (58.1 kg)     Height 04/20/21 2052 5\' 8"  (1.727 m)     Head Circumference --      Peak Flow --      Pain Score --      Pain Loc --      Pain Edu? --      Excl. in GC? --      Physical Exam Vitals and nursing note reviewed.  Constitutional:      General: He is not in acute distress.    Appearance:  He is well-developed.  HENT:     Head: Normocephalic and atraumatic.  Eyes:     Conjunctiva/sclera: Conjunctivae normal.  Cardiovascular:     Rate and Rhythm: Normal rate and regular rhythm.     Heart sounds: Normal heart sounds. No murmur heard. No friction rub.  Pulmonary:     Effort: Pulmonary effort is normal. No respiratory distress.     Breath sounds: Normal breath sounds. No wheezing or rales.  Abdominal:     General: There is no distension.     Palpations: Abdomen is soft.     Tenderness: There is no abdominal tenderness.  Musculoskeletal:     Cervical back: Neck supple.  Skin:    General: Skin is warm.     Capillary Refill: Capillary refill takes less than 2 seconds.  Neurological:     Mental Status: He is alert.     Motor: No abnormal muscle tone.       LOWER EXTREMITY EXAM: RIGHT  INSPECTION & PALPATION: Moderate TTP over R hip, markedly tender with any pROM.  SENSORY: sensation is intact to light touch in:  Superficial peroneal nerve distribution (over dorsum of foot) Deep peroneal nerve distribution (over first dorsal web space) Sural nerve distribution (over lateral aspect 5th metatarsal) Saphenous nerve distribution (over medial instep)  MOTOR:  + Motor EHL (great toe dorsiflexion) + FHL (great toe plantar flexion)  + TA (ankle dorsiflexion)  + GSC (ankle plantar flexion)  VASCULAR: 2+ dorsalis pedis and posterior tibialis pulses Capillary refill < 2 sec, toes warm and well-perfused  COMPARTMENTS: Soft, warm, well-perfused No pain with passive extension No parethesias   ____________________________________________   LABS (all labs ordered are listed, but only abnormal results are displayed)  Labs Reviewed  CBC WITH DIFFERENTIAL/PLATELET - Abnormal; Notable for the following components:      Result Value   RBC 3.36 (*)    Hemoglobin 11.2 (*)    HCT 33.8 (*)    MCV 100.6 (*)    All other components within normal limits  COMPREHENSIVE METABOLIC PANEL - Abnormal; Notable for the following components:   Glucose, Bld 281 (*)    Calcium 8.5 (*)    All other components within normal limits  URINALYSIS, COMPLETE (UACMP) WITH MICROSCOPIC - Abnormal; Notable for the following components:   Color, Urine YELLOW (*)    APPearance HAZY (*)    Protein, ur 30 (*)    All other components within normal limits  RESP PANEL BY RT-PCR (FLU A&B, COVID) ARPGX2  CBC  BASIC METABOLIC PANEL  PROTIME-INR  FOLATE  VITAMIN B12  TYPE AND SCREEN  TROPONIN I (HIGH SENSITIVITY)  TROPONIN I (HIGH SENSITIVITY)    ____________________________________________  EKG: Normal sinus rhythm, ventricular 94.  PR 160, QRS 111, QTc 47.  No acute ST elevations or depressions. ________________________________________  RADIOLOGY All  imaging, including plain films, CT scans, and ultrasounds, independently reviewed by me, and interpretations confirmed via formal radiology reads.  ED MD interpretation:   Chest x-ray: Possible mild CHF CT head/C-spine: Negative, no acute fracture, no bleed X-ray hip left: Negative X-ray hip right: Incomplete intratrochanteric fracture  Official radiology report(s): CT Head Wo Contrast  Result Date: 04/20/2021 CLINICAL DATA:  Fall EXAM: CT HEAD WITHOUT CONTRAST TECHNIQUE: Contiguous axial images were obtained from the base of the skull through the vertex without intravenous contrast. COMPARISON:  None. FINDINGS: Brain: Right temporal and parietal encephalomalacia is unchanged. Multiple old small vessel infarcts. Generalized atrophy. There is periventricular hypoattenuation compatible  with chronic microvascular disease. No acute hemorrhage. Vascular: No abnormal hyperdensity of the major intracranial arteries or dural venous sinuses. No intracranial atherosclerosis. Skull: The visualized skull base, calvarium and extracranial soft tissues are normal. Sinuses/Orbits: No fluid levels or advanced mucosal thickening of the visualized paranasal sinuses. No mastoid or middle ear effusion. The orbits are normal. IMPRESSION: 1. No acute intracranial abnormality. 2. Unchanged right temporal and parietal encephalomalacia and multiple old small vessel infarcts. Electronically Signed   By: Deatra Robinson M.D.   On: 04/20/2021 22:26   CT Cervical Spine Wo Contrast  Result Date: 04/20/2021 CLINICAL DATA:  Fall EXAM: CT CERVICAL SPINE WITHOUT CONTRAST TECHNIQUE: Multidetector CT imaging of the cervical spine was performed without intravenous contrast. Multiplanar CT image reconstructions were also generated. COMPARISON:  None. FINDINGS: Alignment: No static subluxation. Facets are aligned. Occipital condyles and the lateral masses of C1 and C2 are normally approximated. Skull base and vertebrae: No acute fracture.  Soft tissues and spinal canal: No prevertebral fluid or swelling. No visible canal hematoma. Disc levels: Multilevel degenerative change without high-grade spinal canal stenosis. Upper chest: No pneumothorax, pulmonary nodule or pleural effusion. Other: Normal visualized paraspinal cervical soft tissues. IMPRESSION: No acute fracture or static subluxation of the cervical spine. Electronically Signed   By: Deatra Robinson M.D.   On: 04/20/2021 22:31   DG Chest Portable 1 View  Result Date: 04/20/2021 CLINICAL DATA:  Weakness EXAM: PORTABLE CHEST 1 VIEW COMPARISON:  02/11/2016 FINDINGS: Cardiac shadow is within normal limits. Previously seen nodular density in the left upper lobe is not well appreciated. Calcification is noted in the left base stable in appearance from the prior exam. Increased vascular congestion and interstitial edema is noted. No bony abnormality is noted. IMPRESSION: Mild changes of CHF. Electronically Signed   By: Alcide Clever M.D.   On: 04/20/2021 21:28   DG HIP UNILAT WITH PELVIS 2-3 VIEWS LEFT  Result Date: 04/20/2021 CLINICAL DATA:  Bilateral hip pain and weakness, initial encounter EXAM: DG HIP (WITH OR WITHOUT PELVIS) 2V LEFT COMPARISON:  None. FINDINGS: There is no evidence of hip fracture or dislocation. There is no evidence of arthropathy or other focal bone abnormality. IMPRESSION: No acute abnormality noted. Electronically Signed   By: Alcide Clever M.D.   On: 04/20/2021 22:34   DG HIP UNILAT WITH PELVIS 2-3 VIEWS RIGHT  Result Date: 04/20/2021 CLINICAL DATA:  Bilateral hip pain and weakness, no known injury, initial encounter EXAM: DG HIP (WITH OR WITHOUT PELVIS) 3V RIGHT COMPARISON:  None. FINDINGS: Pelvic ring is intact. Degenerative changes of the lumbar spine are seen. Lucency is noted in the greater trochanter consistent with an incomplete intratrochanteric fracture. No other focal abnormality is noted. IMPRESSION: Incomplete intratrochanteric fracture on the right.  Electronically Signed   By: Alcide Clever M.D.   On: 04/20/2021 22:34    ____________________________________________  PROCEDURES   Procedure(s) performed (including Critical Care):  Procedures  ____________________________________________  INITIAL IMPRESSION / MDM / ASSESSMENT AND PLAN / ED COURSE  As part of my medical decision making, I reviewed the following data within the electronic MEDICAL RECORD NUMBER Nursing notes reviewed and incorporated, Old chart reviewed, Notes from prior ED visits, and [place] Controlled Substance Database       *Alex Mclaughlin was evaluated in Emergency Department on 04/20/2021 for the symptoms described in the history of present illness. He was evaluated in the context of the global COVID-19 pandemic, which necessitated consideration that the patient might be at risk  for infection with the SARS-CoV-2 virus that causes COVID-19. Institutional protocols and algorithms that pertain to the evaluation of patients at risk for COVID-19 are in a state of rapid change based on information released by regulatory bodies including the CDC and federal and state organizations. These policies and algorithms were followed during the patient's care in the ED.  Some ED evaluations and interventions may be delayed as a result of limited staffing during the pandemic.*     Medical Decision Making: 87 year old male here with mechanical fall and right hip pain.  Patient is otherwise fairly healthy and at his medical baseline.  Imaging shows right intratrochanteric fracture.  Discussed with orthopedics, will admit.  N.p.o. at midnight.  Otherwise, no apparent other signs of trauma.  Screening lab work has been sent.  Chest x-ray read as possible mild edema though he is not hypoxic and has no shortness of breath or increased work of breathing.  Labs are otherwise unremarkable.  CBC without leukocytosis.  CMP unremarkable.  UA without  UTI.  ____________________________________________  FINAL CLINICAL IMPRESSION(S) / ED DIAGNOSES  Final diagnoses:  Closed fracture of right hip, initial encounter (HCC)  Fall, initial encounter     MEDICATIONS GIVEN DURING THIS VISIT:  Medications  HYDROcodone-acetaminophen (NORCO/VICODIN) 5-325 MG per tablet 1 tablet (has no administration in time range)  HYDROmorphone (DILAUDID) injection 0.5 mg (has no administration in time range)  polyethylene glycol (MIRALAX / GLYCOLAX) packet 17 g (has no administration in time range)  acetaminophen (TYLENOL) tablet 1,000 mg (1,000 mg Oral Given 04/20/21 2330)  fentaNYL (SUBLIMAZE) injection 12.5 mcg (12.5 mcg Intravenous Given 04/20/21 2330)     ED Discharge Orders    None       Note:  This document was prepared using Dragon voice recognition software and may include unintentional dictation errors.   Shaune Pollack, MD 04/20/21 2337

## 2021-04-20 NOTE — ED Notes (Signed)
Pt resting in bed at this time with eyes closed.  Warm blankets provided to pt and pain medicine given per request.  No other needs identified at this time.

## 2021-04-21 ENCOUNTER — Encounter
Admission: EM | Disposition: A | Discharge status: Home Health | Payer: Self-pay | Source: Home / Self Care | Attending: Internal Medicine

## 2021-04-21 ENCOUNTER — Observation Stay: Payer: Medicare Other

## 2021-04-21 DIAGNOSIS — R4189 Other symptoms and signs involving cognitive functions and awareness: Secondary | ICD-10-CM

## 2021-04-21 DIAGNOSIS — S72141A Displaced intertrochanteric fracture of right femur, initial encounter for closed fracture: Secondary | ICD-10-CM | POA: Diagnosis not present

## 2021-04-21 DIAGNOSIS — D539 Nutritional anemia, unspecified: Secondary | ICD-10-CM | POA: Diagnosis not present

## 2021-04-21 LAB — CBC
HCT: 32.2 % — ABNORMAL LOW (ref 39.0–52.0)
Hemoglobin: 11 g/dL — ABNORMAL LOW (ref 13.0–17.0)
MCH: 33.5 pg (ref 26.0–34.0)
MCHC: 34.2 g/dL (ref 30.0–36.0)
MCV: 98.2 fL (ref 80.0–100.0)
Platelets: 196 10*3/uL (ref 150–400)
RBC: 3.28 MIL/uL — ABNORMAL LOW (ref 4.22–5.81)
RDW: 14.4 % (ref 11.5–15.5)
WBC: 10.3 10*3/uL (ref 4.0–10.5)
nRBC: 0 % (ref 0.0–0.2)

## 2021-04-21 LAB — TYPE AND SCREEN
ABO/RH(D): O POS
Antibody Screen: NEGATIVE

## 2021-04-21 LAB — PROTIME-INR
INR: 1 (ref 0.8–1.2)
Prothrombin Time: 13.6 seconds (ref 11.4–15.2)

## 2021-04-21 LAB — TROPONIN I (HIGH SENSITIVITY): Troponin I (High Sensitivity): 5 ng/L (ref <18)

## 2021-04-21 LAB — FOLATE: Folate: 9.4 ng/mL (ref >5.9)

## 2021-04-21 SURGERY — FIXATION, FRACTURE, INTERTROCHANTERIC, WITH INTRAMEDULLARY ROD
Anesthesia: Choice | Laterality: Right

## 2021-04-21 MED ORDER — CEFAZOLIN (ANCEF) 1 G IV SOLR: 1.0000 g | INTRAVENOUS | Status: DC

## 2021-04-21 MED ORDER — CEFAZOLIN SODIUM-DEXTROSE 1-4 GM/50ML-% IV SOLN: 1.0000 g | INTRAVENOUS | Status: DC

## 2021-04-21 MED ORDER — ENOXAPARIN SODIUM 40 MG/0.4ML IJ SOSY
40.0000 mg | PREFILLED_SYRINGE | INTRAMUSCULAR | Status: DC
  Administered 2021-04-21 – 2021-04-25 (×4): 40 mg via SUBCUTANEOUS
  Filled 2021-04-21 (×5): qty 0.4

## 2021-04-21 NOTE — ED Notes (Signed)
Offered patient food, patient refused.

## 2021-04-21 NOTE — ED Notes (Signed)
Pt sitting up in bed trying to scoot down, pt states he wants to get up, reminded pt that he has a broken hip and could not get up- pt verbalized understanding- pt given meal tray and TV turned on for pt

## 2021-04-21 NOTE — ED Notes (Signed)
Pt requesting urinal, advised pt that he had an external catheter and could go when he needed to- pt requesting TV remote

## 2021-04-21 NOTE — Consult Note (Addendum)
ORTHOPAEDIC CONSULTATION  REQUESTING PHYSICIAN: Charise Killian, MD  Chief Complaint: Right hip pain  HPI: Alex Mclaughlin is a 87 y.o. male who complains of right hip pain after a fall last night.  X-rays and CT performed in the ER showed presence of an incomplete intertrochanteric hip fracture.  Orthopedics was consulted regarding further management.  Patient is hard of hearing and much of the history was taken from the chart. Past medical history is notable for significant of cognitive decline, COPD, diverticulosis, esophageal stricture, GERD, hyperlipidemia, renal calculi, psoriasis, skin cancer, spinal stenosis, CVA, hard of hearing.  He is not on any anticoagulation.  History reviewed. No pertinent past medical history. History reviewed. No pertinent surgical history. Social History   Socioeconomic History  . Marital status: Married    Spouse name: Not on file  . Number of children: Not on file  . Years of education: Not on file  . Highest education level: Not on file  Occupational History  . Not on file  Tobacco Use  . Smoking status: Not on file  . Smokeless tobacco: Not on file  Substance and Sexual Activity  . Alcohol use: Not on file  . Drug use: Not on file  . Sexual activity: Not on file  Other Topics Concern  . Not on file  Social History Narrative  . Not on file   Social Determinants of Health   Financial Resource Strain: Not on file  Food Insecurity: Not on file  Transportation Needs: Not on file  Physical Activity: Not on file  Stress: Not on file  Social Connections: Not on file   Family History  Problem Relation Age of Onset  . Stomach cancer Mother   . Stomach cancer Father    No Known Allergies Prior to Admission medications   Medication Sig Start Date End Date Taking? Authorizing Provider  acetaminophen (TYLENOL) 325 MG tablet Take 650 mg by mouth every 4 (four) hours as needed for pain.   Yes [provider]   CT Head Wo  Contrast  Result Date: 04/20/2021 CLINICAL DATA:  Fall EXAM: CT HEAD WITHOUT CONTRAST TECHNIQUE: Contiguous axial images were obtained from the base of the skull through the vertex without intravenous contrast. COMPARISON:  None. FINDINGS: Brain: Right temporal and parietal encephalomalacia is unchanged. Multiple old small vessel infarcts. Generalized atrophy. There is periventricular hypoattenuation compatible with chronic microvascular disease. No acute hemorrhage. Vascular: No abnormal hyperdensity of the major intracranial arteries or dural venous sinuses. No intracranial atherosclerosis. Skull: The visualized skull base, calvarium and extracranial soft tissues are normal. Sinuses/Orbits: No fluid levels or advanced mucosal thickening of the visualized paranasal sinuses. No mastoid or middle ear effusion. The orbits are normal. IMPRESSION: 1. No acute intracranial abnormality. 2. Unchanged right temporal and parietal encephalomalacia and multiple old small vessel infarcts. Electronically Signed   By: Deatra Robinson M.D.   On: 04/20/2021 22:26   CT Cervical Spine Wo Contrast  Result Date: 04/20/2021 CLINICAL DATA:  Fall EXAM: CT CERVICAL SPINE WITHOUT CONTRAST TECHNIQUE: Multidetector CT imaging of the cervical spine was performed without intravenous contrast. Multiplanar CT image reconstructions were also generated. COMPARISON:  None. FINDINGS: Alignment: No static subluxation. Facets are aligned. Occipital condyles and the lateral masses of C1 and C2 are normally approximated. Skull base and vertebrae: No acute fracture. Soft tissues and spinal canal: No prevertebral fluid or swelling. No visible canal hematoma. Disc levels: Multilevel degenerative change without high-grade spinal canal stenosis. Upper chest: No pneumothorax, pulmonary nodule  or pleural effusion. Other: Normal visualized paraspinal cervical soft tissues. IMPRESSION: No acute fracture or static subluxation of the cervical spine.  Electronically Signed   By: Deatra Robinson M.D.   On: 04/20/2021 22:31   CT Hip Right Wo Contrast  Result Date: 04/21/2021 CLINICAL DATA:  Fall, right hip fracture EXAM: CT OF THE RIGHT HIP WITHOUT CONTRAST TECHNIQUE: Multidetector CT imaging of the right hip was performed according to the standard protocol. Multiplanar CT image reconstructions were also generated. COMPARISON:  Plain radiographs 04/20/2021 FINDINGS: Bones/Joint/Cartilage There is an acute, minimally comminuted fracture of the posterosuperior aspect of the greater trochanter. The fracture plane does not extend into the intratrochanteric region or the femoral neck. The femoral head is still seated within the right acetabulum. The visualized sacrum and the right ilium are intact. There is mild right hip degenerative arthritis noted. Ligaments Suboptimally assessed by CT. Muscles and Tendons The fracture fragments appear medial and superior to the insertion of the a gluteal tendons upon the greater trochanter. Iliopsoas and hamstring tendons are intact. Mild fatty infiltration of the a gluteal and iliopsoas and adductor musculature. Soft tissues A a small inter fascial hematoma is seen adjacent to the fractured greater trochanter measuring at least 2.3 by 6.0 cm in greatest dimension. Small fat containing inguinal hernia is incidentally noted. Vascular calcifications are noted within the visualized lower extremity arterial inflow and outflow. IMPRESSION: Acute, mildly comminuted fracture of the greater trochanter of the right hip without extension of the fracture plane into the intratrochanteric region or femoral neck. No dislocation. The fracture fragment appears separate from the insertion of the gluteal tendons. Small inter fascial hematoma adjacent to the fractured greater trochanter. Electronically Signed   By: Helyn Numbers MD   On: 04/21/2021 00:28   DG Chest Portable 1 View  Result Date: 04/20/2021 CLINICAL DATA:  Weakness EXAM: PORTABLE  CHEST 1 VIEW COMPARISON:  02/11/2016 FINDINGS: Cardiac shadow is within normal limits. Previously seen nodular density in the left upper lobe is not well appreciated. Calcification is noted in the left base stable in appearance from the prior exam. Increased vascular congestion and interstitial edema is noted. No bony abnormality is noted. IMPRESSION: Mild changes of CHF. Electronically Signed   By: Alcide Clever M.D.   On: 04/20/2021 21:28   DG HIP UNILAT WITH PELVIS 2-3 VIEWS LEFT  Result Date: 04/20/2021 CLINICAL DATA:  Bilateral hip pain and weakness, initial encounter EXAM: DG HIP (WITH OR WITHOUT PELVIS) 2V LEFT COMPARISON:  None. FINDINGS: There is no evidence of hip fracture or dislocation. There is no evidence of arthropathy or other focal bone abnormality. IMPRESSION: No acute abnormality noted. Electronically Signed   By: Alcide Clever M.D.   On: 04/20/2021 22:34   DG HIP UNILAT WITH PELVIS 2-3 VIEWS RIGHT  Result Date: 04/20/2021 CLINICAL DATA:  Bilateral hip pain and weakness, no known injury, initial encounter EXAM: DG HIP (WITH OR WITHOUT PELVIS) 3V RIGHT COMPARISON:  None. FINDINGS: Pelvic ring is intact. Degenerative changes of the lumbar spine are seen. Lucency is noted in the greater trochanter consistent with an incomplete intratrochanteric fracture. No other focal abnormality is noted. IMPRESSION: Incomplete intratrochanteric fracture on the right. Electronically Signed   By: Alcide Clever M.D.   On: 04/20/2021 22:34    Positive ROS: All other systems have been reviewed and were otherwise negative with the exception of those mentioned in the HPI and as above.  Physical Exam: General: Alert, no acute distress Cardiovascular: No pedal edema  Respiratory: No cyanosis, no use of accessory musculature GI: No organomegaly, abdomen is soft and non-tender Skin: No lesions in the area of chief complaint Neurologic: Sensation intact distally Psychiatric: Patient is competent for consent  with normal mood and affect Lymphatic: No axillary or cervical lymphadenopathy  MUSCULOSKELETAL:  Right hip: Mild tenderness to palpation, right lower extremities grossly neurovascular intact  Assessment: 87 year old male who sustained a fracture of the right greater trochanter without intertrochanteric extension after a mechanical fall.  Patient has been admitted to the hospitalist service.  Plan: We reviewed treatment options moving forward, and I will plan to discuss these with the family as well. It appears that the CT shows the fracture to be isolated to the greater trochanter without extension into the intertrochanteric region.  As a result, this fracture is more stable and does not require operative intervention.  Recommendation is made for protected weightbearing with physical therapy, either touchdown or partial weightbearing as tolerated based on pain.    Ross Marcus, MD   04/21/2021 7:57 AM

## 2021-04-21 NOTE — ED Notes (Signed)
Fall alarm placed on pt

## 2021-04-21 NOTE — ED Notes (Signed)
Caregiver at bedside. Pt eating at this time.

## 2021-04-21 NOTE — ED Notes (Addendum)
Patient found standing in room with gown off. Patient assisted back to bed by this RN, Associate Professor, and Faith, NT. Patient redressed and is now back in bed resting comfortably. Door left open to see patient. Patient resting comfortably at this time. Charge RN aware.

## 2021-04-21 NOTE — Progress Notes (Signed)
PROGRESS NOTE    Alex McmurrayJack D Mclaughlin  WUJ:[id number] DOB: [birth year] DOA: 04/20/2021 PCP: Jerl MinaHedrick, James, MD   Assessment & Plan:   Principal Problem:   Closed intertrochanteric fracture of hip, right, initial encounter [facility](HCC) Active Problems:   Hard of hearing   Macrocytic anemia   Right hip fracture: of the greater trochanter w/o extension into the intertrochanteric region. Secondary to fall at home. No surgical intervention needed as per ortho surg. Dilaudid, norco prn for pain. Ortho surg recs apprec. PT/OT consulted   Macrocytic anemia: folate is WNL. B12 is pending still  Likely cognitive impairment: continue w/ supportive care. Not a recent PCP appointment      DVT prophylaxis: lovenox  Code Status: full  Family Communication: discussed pt's care, Don, and answered his questions  Disposition Plan: depends on PT/OT recs  Level of care: Med-Surg   Status is: Observation  The patient remains OBS appropriate and will d/c before 2 midnights.  Dispo: The patient is from: Home              Anticipated d/c is to: SNF              Patient currently is not medically stable to d/c.   Difficult to place patient : unclear     Consultants:   Ortho surg    Procedures:    Antimicrobials:    Subjective: Pt c/o hip pain   Objective: Vitals:   04/20/21 2050 04/20/21 2052 04/21/21 0220 04/21/21 0534  BP: 103/79  122/82 136/80  Pulse: 90  67 69  Resp: (!) 21  19 18   Temp: 97.8 F (36.6 C)     TempSrc: Oral     SpO2: 96%  97% 93%  Weight:  58.1 kg    Height:  5\' 8"  (1.727 m)     No intake or output data in the 24 hours ending 04/21/21 0813 Filed Weights   04/20/21 2052  Weight: 58.1 kg    Examination:  General exam: Appears calm and comfortable  Respiratory system: Clear to auscultation. Respiratory effort normal. Cardiovascular system: S1 & S2 +. No rubs, gallops or clicks.  Gastrointestinal system: Abdomen is nondistended, soft and nontender. Normal  bowel sounds heard. Central nervous system: Alert and oriented. Moves all extremities  Psychiatry: Judgement and insight appear abnormal. Flat mood and affect     Data Reviewed: I have personally reviewed following labs and imaging studies  CBC: Recent Labs  Lab 04/20/21 2054 04/21/21 0512  WBC 9.0 10.3  NEUTROABS 6.6  --   HGB 11.2* 11.0*  HCT 33.8* 32.2*  MCV 100.6* 98.2  PLT 217 196   Basic Metabolic Panel: Recent Labs  Lab 04/20/21 2054  NA 136  K 3.9  CL 101  CO2 23  GLUCOSE 281*  BUN 17  CREATININE 1.08  CALCIUM 8.5*   GFR: Estimated Creatinine Clearance: 34.4 mL/min (by C-G formula based on SCr of 1.08 mg/dL). Liver Function Tests: Recent Labs  Lab 04/20/21 2054  AST 16  ALT 8  ALKPHOS 85  BILITOT 0.5  PROT 7.0  ALBUMIN 3.5   No results for input(s): LIPASE, AMYLASE in the last 168 hours. No results for input(s): AMMONIA in the last 168 hours. Coagulation Profile: Recent Labs  Lab 04/21/21 0512  INR 1.0   Cardiac Enzymes: No results for input(s): CKTOTAL, CKMB, CKMBINDEX, TROPONINI in the last 168 hours. BNP (last 3 results) No results for input(s): PROBNP in the last 8760 hours. HbA1C: No results  for input(s): HGBA1C in the last 72 hours. CBG: No results for input(s): GLUCAP in the last 168 hours. Lipid Profile: No results for input(s): CHOL, HDL, LDLCALC, TRIG, CHOLHDL, LDLDIRECT in the last 72 hours. Thyroid Function Tests: No results for input(s): TSH, T4TOTAL, FREET4, T3FREE, THYROIDAB in the last 72 hours. Anemia Panel: Recent Labs    04/20/21 2333  FOLATE 9.4   Sepsis Labs: No results for input(s): PROCALCITON, LATICACIDVEN in the last 168 hours.  Recent Results (from the past 240 hour(s))  Resp Panel by RT-PCR (Flu A&B, Covid) Nasopharyngeal Swab     Status: None   Collection Time: 04/20/21  9:45 PM   Specimen: Nasopharyngeal Swab; Nasopharyngeal(NP) swabs in vial transport medium  Result Value Ref Range Status   SARS  Coronavirus 2 by RT PCR NEGATIVE NEGATIVE Final    Comment: (NOTE) SARS-CoV-2 target nucleic acids are NOT DETECTED.  The SARS-CoV-2 RNA is generally detectable in upper respiratory specimens during the acute phase of infection. The lowest concentration of SARS-CoV-2 viral copies this assay can detect is 138 copies/mL. A negative result does not preclude SARS-Cov-2 infection and should not be used as the sole basis for treatment or other patient management decisions. A negative result may occur with  improper specimen collection/handling, submission of specimen other than nasopharyngeal swab, presence of viral mutation(s) within the areas targeted by this assay, and inadequate number of viral copies(<138 copies/mL). A negative result must be combined with clinical observations, patient history, and epidemiological information. The expected result is Negative.  Fact Sheet for Patients:  BloggerCourse.com  Fact Sheet for Healthcare Providers:  SeriousBroker.it  This test is no t yet approved or cleared by the [place] FDA and  has been authorized for detection and/or diagnosis of SARS-CoV-2 by FDA under an Emergency Use Authorization (EUA). This EUA will remain  in effect (meaning this test can be used) for the duration of the COVID-19 declaration under Section 564(b)(1) of the Act, 21 U.S.C.section 360bbb-3(b)(1), unless the authorization is terminated  or revoked sooner.       Influenza A by PCR NEGATIVE NEGATIVE Final   Influenza B by PCR NEGATIVE NEGATIVE Final    Comment: (NOTE) The Xpert Xpress SARS-CoV-2/FLU/RSV plus assay is intended as an aid in the diagnosis of influenza from Nasopharyngeal swab specimens and should not be used as a sole basis for treatment. Nasal washings and aspirates are unacceptable for Xpert Xpress SARS-CoV-2/FLU/RSV testing.  Fact Sheet for  Patients: BloggerCourse.com  Fact Sheet for Healthcare Providers: SeriousBroker.it  This test is not yet approved or cleared by the [place] FDA and has been authorized for detection and/or diagnosis of SARS-CoV-2 by FDA under an Emergency Use Authorization (EUA). This EUA will remain in effect (meaning this test can be used) for the duration of the COVID-19 declaration under Section 564(b)(1) of the Act, 21 U.S.C. section 360bbb-3(b)(1), unless the authorization is terminated or revoked.  Performed at [facility], [address], [place], [place] [address]          Radiology Studies: CT Head Wo Contrast  Result Date: 04/20/2021 CLINICAL DATA:  Fall EXAM: CT HEAD WITHOUT CONTRAST TECHNIQUE: Contiguous axial images were obtained from the base of the skull through the vertex without intravenous contrast. COMPARISON:  None. FINDINGS: Brain: Right temporal and parietal encephalomalacia is unchanged. Multiple old small vessel infarcts. Generalized atrophy. There is periventricular hypoattenuation compatible with chronic microvascular disease. No acute hemorrhage. Vascular: No abnormal hyperdensity of the major intracranial arteries or  dural venous sinuses. No intracranial atherosclerosis. Skull: The visualized skull base, calvarium and extracranial soft tissues are normal. Sinuses/Orbits: No fluid levels or advanced mucosal thickening of the visualized paranasal sinuses. No mastoid or middle ear effusion. The orbits are normal. IMPRESSION: 1. No acute intracranial abnormality. 2. Unchanged right temporal and parietal encephalomalacia and multiple old small vessel infarcts. Electronically Signed   By: Deatra Robinson M.D.   On: 04/20/2021 22:26   CT Cervical Spine Wo Contrast  Result Date: 04/20/2021 CLINICAL DATA:  Fall EXAM: CT CERVICAL SPINE WITHOUT CONTRAST TECHNIQUE: Multidetector CT imaging of the cervical spine was  performed without intravenous contrast. Multiplanar CT image reconstructions were also generated. COMPARISON:  None. FINDINGS: Alignment: No static subluxation. Facets are aligned. Occipital condyles and the lateral masses of C1 and C2 are normally approximated. Skull base and vertebrae: No acute fracture. Soft tissues and spinal canal: No prevertebral fluid or swelling. No visible canal hematoma. Disc levels: Multilevel degenerative change without high-grade spinal canal stenosis. Upper chest: No pneumothorax, pulmonary nodule or pleural effusion. Other: Normal visualized paraspinal cervical soft tissues. IMPRESSION: No acute fracture or static subluxation of the cervical spine. Electronically Signed   By: Deatra Robinson M.D.   On: 04/20/2021 22:31   CT Hip Right Wo Contrast  Result Date: 04/21/2021 CLINICAL DATA:  Fall, right hip fracture EXAM: CT OF THE RIGHT HIP WITHOUT CONTRAST TECHNIQUE: Multidetector CT imaging of the right hip was performed according to the standard protocol. Multiplanar CT image reconstructions were also generated. COMPARISON:  Plain radiographs 04/20/2021 FINDINGS: Bones/Joint/Cartilage There is an acute, minimally comminuted fracture of the posterosuperior aspect of the greater trochanter. The fracture plane does not extend into the intratrochanteric region or the femoral neck. The femoral head is still seated within the right acetabulum. The visualized sacrum and the right ilium are intact. There is mild right hip degenerative arthritis noted. Ligaments Suboptimally assessed by CT. Muscles and Tendons The fracture fragments appear medial and superior to the insertion of the a gluteal tendons upon the greater trochanter. Iliopsoas and hamstring tendons are intact. Mild fatty infiltration of the a gluteal and iliopsoas and adductor musculature. Soft tissues A a small inter fascial hematoma is seen adjacent to the fractured greater trochanter measuring at least 2.3 by 6.0 cm in greatest  dimension. Small fat containing inguinal hernia is incidentally noted. Vascular calcifications are noted within the visualized lower extremity arterial inflow and outflow. IMPRESSION: Acute, mildly comminuted fracture of the greater trochanter of the right hip without extension of the fracture plane into the intratrochanteric region or femoral neck. No dislocation. The fracture fragment appears separate from the insertion of the gluteal tendons. Small inter fascial hematoma adjacent to the fractured greater trochanter. Electronically Signed   By: Helyn Numbers MD   On: 04/21/2021 00:28   DG Chest Portable 1 View  Result Date: 04/20/2021 CLINICAL DATA:  Weakness EXAM: PORTABLE CHEST 1 VIEW COMPARISON:  02/11/2016 FINDINGS: Cardiac shadow is within normal limits. Previously seen nodular density in the left upper lobe is not well appreciated. Calcification is noted in the left base stable in appearance from the prior exam. Increased vascular congestion and interstitial edema is noted. No bony abnormality is noted. IMPRESSION: Mild changes of CHF. Electronically Signed   By: Alcide Clever M.D.   On: 04/20/2021 21:28   DG HIP UNILAT WITH PELVIS 2-3 VIEWS LEFT  Result Date: 04/20/2021 CLINICAL DATA:  Bilateral hip pain and weakness, initial encounter EXAM: DG HIP (WITH OR WITHOUT PELVIS)  2V LEFT COMPARISON:  None. FINDINGS: There is no evidence of hip fracture or dislocation. There is no evidence of arthropathy or other focal bone abnormality. IMPRESSION: No acute abnormality noted. Electronically Signed   By: Alcide Clever M.D.   On: 04/20/2021 22:34   DG HIP UNILAT WITH PELVIS 2-3 VIEWS RIGHT  Result Date: 04/20/2021 CLINICAL DATA:  Bilateral hip pain and weakness, no known injury, initial encounter EXAM: DG HIP (WITH OR WITHOUT PELVIS) 3V RIGHT COMPARISON:  None. FINDINGS: Pelvic ring is intact. Degenerative changes of the lumbar spine are seen. Lucency is noted in the greater trochanter consistent with an  incomplete intratrochanteric fracture. No other focal abnormality is noted. IMPRESSION: Incomplete intratrochanteric fracture on the right. Electronically Signed   By: Alcide Clever M.D.   On: 04/20/2021 22:34        Scheduled Meds: Continuous Infusions:   LOS: 0 days    Time spent: 33 mins     Charise Killian, MD [facility] Pager [phone]  If 7PM-7AM, please contact night-coverage  04/21/2021, 8:13 AM

## 2021-04-21 NOTE — ED Notes (Signed)
Pt assisted with use of urinal at this time.  Pt denies any other needs.

## 2021-04-22 ENCOUNTER — Encounter: Payer: Self-pay | Admitting: Internal Medicine

## 2021-04-22 DIAGNOSIS — I5041 Acute combined systolic (congestive) and diastolic (congestive) heart failure: Secondary | ICD-10-CM | POA: Diagnosis not present

## 2021-04-22 DIAGNOSIS — S72111A Displaced fracture of greater trochanter of right femur, initial encounter for closed fracture: Secondary | ICD-10-CM | POA: Diagnosis present

## 2021-04-22 DIAGNOSIS — R2681 Unsteadiness on feet: Secondary | ICD-10-CM | POA: Diagnosis present

## 2021-04-22 DIAGNOSIS — R52 Pain, unspecified: Secondary | ICD-10-CM | POA: Diagnosis present

## 2021-04-22 DIAGNOSIS — Y92009 Unspecified place in unspecified non-institutional (private) residence as the place of occurrence of the external cause: Secondary | ICD-10-CM | POA: Diagnosis not present

## 2021-04-22 DIAGNOSIS — E876 Hypokalemia: Secondary | ICD-10-CM | POA: Diagnosis present

## 2021-04-22 DIAGNOSIS — I509 Heart failure, unspecified: Secondary | ICD-10-CM

## 2021-04-22 DIAGNOSIS — J449 Chronic obstructive pulmonary disease, unspecified: Secondary | ICD-10-CM | POA: Diagnosis present

## 2021-04-22 DIAGNOSIS — D519 Vitamin B12 deficiency anemia, unspecified: Secondary | ICD-10-CM | POA: Diagnosis present

## 2021-04-22 DIAGNOSIS — S72001A Fracture of unspecified part of neck of right femur, initial encounter for closed fracture: Secondary | ICD-10-CM | POA: Diagnosis present

## 2021-04-22 DIAGNOSIS — R4189 Other symptoms and signs involving cognitive functions and awareness: Secondary | ICD-10-CM | POA: Diagnosis not present

## 2021-04-22 DIAGNOSIS — S72141A Displaced intertrochanteric fracture of right femur, initial encounter for closed fracture: Secondary | ICD-10-CM | POA: Diagnosis not present

## 2021-04-22 DIAGNOSIS — D539 Nutritional anemia, unspecified: Secondary | ICD-10-CM | POA: Diagnosis not present

## 2021-04-22 DIAGNOSIS — Z20822 Contact with and (suspected) exposure to covid-19: Secondary | ICD-10-CM | POA: Diagnosis present

## 2021-04-22 DIAGNOSIS — W1830XA Fall on same level, unspecified, initial encounter: Secondary | ICD-10-CM | POA: Diagnosis present

## 2021-04-22 DIAGNOSIS — Z8673 Personal history of transient ischemic attack (TIA), and cerebral infarction without residual deficits: Secondary | ICD-10-CM | POA: Diagnosis not present

## 2021-04-22 DIAGNOSIS — D509 Iron deficiency anemia, unspecified: Secondary | ICD-10-CM | POA: Diagnosis present

## 2021-04-22 DIAGNOSIS — H9190 Unspecified hearing loss, unspecified ear: Secondary | ICD-10-CM | POA: Diagnosis present

## 2021-04-22 DIAGNOSIS — I5032 Chronic diastolic (congestive) heart failure: Secondary | ICD-10-CM | POA: Diagnosis present

## 2021-04-22 LAB — CBC
HCT: 32.6 % — ABNORMAL LOW (ref 39.0–52.0)
Hemoglobin: 11.2 g/dL — ABNORMAL LOW (ref 13.0–17.0)
MCH: 33.3 pg (ref 26.0–34.0)
MCHC: 34.4 g/dL (ref 30.0–36.0)
MCV: 97 fL (ref 80.0–100.0)
Platelets: 198 10*3/uL (ref 150–400)
RBC: 3.36 MIL/uL — ABNORMAL LOW (ref 4.22–5.81)
RDW: 13.9 % (ref 11.5–15.5)
WBC: 12.6 10*3/uL — ABNORMAL HIGH (ref 4.0–10.5)
nRBC: 0 % (ref 0.0–0.2)

## 2021-04-22 LAB — BASIC METABOLIC PANEL
Anion gap: 11 (ref 5–15)
BUN: 18 mg/dL (ref 8–23)
CO2: 24 mmol/L (ref 22–32)
Calcium: 9 mg/dL (ref 8.9–10.3)
Chloride: 102 mmol/L (ref 98–111)
Creatinine, Ser: 0.78 mg/dL (ref 0.61–1.24)
GFR, Estimated: >60 mL/min (ref >60)
Glucose, Bld: 113 mg/dL — ABNORMAL HIGH (ref 70–99)
Potassium: 3.8 mmol/L (ref 3.5–5.1)
Sodium: 137 mmol/L (ref 135–145)

## 2021-04-22 LAB — VITAMIN B12: Vitamin B-12: 113 pg/mL — ABNORMAL LOW (ref 180–914)

## 2021-04-22 MED ORDER — CYANOCOBALAMIN 1000 MCG/ML IJ SOLN
1000.0000 ug | Freq: Once | INTRAMUSCULAR | Status: AC
  Administered 2021-04-22: 1000 ug via INTRAMUSCULAR
  Filled 2021-04-22: qty 1

## 2021-04-22 MED ORDER — DEXTROSE-NACL 5-0.9 % IV SOLN: INTRAVENOUS | Status: DC

## 2021-04-22 MED ORDER — FUROSEMIDE 10 MG/ML IJ SOLN
40.0000 mg | Freq: Once | INTRAMUSCULAR | Status: AC
  Administered 2021-04-22: 40 mg via INTRAVENOUS
  Filled 2021-04-22: qty 4

## 2021-04-22 MED ORDER — ALPRAZOLAM 0.25 MG PO TABS
0.2500 mg | ORAL_TABLET | Freq: Once | ORAL | Status: AC
  Administered 2021-04-22: 0.25 mg via ORAL
  Filled 2021-04-22: qty 1

## 2021-04-22 MED ORDER — ACETAMINOPHEN 325 MG PO TABS
650.0000 mg | ORAL_TABLET | Freq: Four times a day (QID) | ORAL | Status: DC | PRN
  Administered 2021-04-22 – 2021-04-24 (×4): 650 mg via ORAL
  Filled 2021-04-22 (×5): qty 2

## 2021-04-22 MED ORDER — VITAMIN B-12 1000 MCG PO TABS
1000.0000 ug | ORAL_TABLET | Freq: Every day | ORAL | Status: DC
  Administered 2021-04-23 – 2021-04-25 (×3): 1000 ug via ORAL
  Filled 2021-04-22 (×3): qty 1

## 2021-04-22 NOTE — Progress Notes (Signed)
PT Cancellation Note  Patient Details Name: Alex Mclaughlin MRN: [id number] DOB: [birth year]   Cancelled Treatment:    Reason Eval/Treat Not Completed: Patient's level of consciousness (Attempted to work with patient. Pt somewhat aggitated, perseverativng on toileting, not able to redirect. Will attempt again later date/time.)  11:32 AM, 04/22/21 Rosamaria Lints, PT, DPT Physical Therapist - [facility]  [phone] (ASCOM)   Jentry Mcqueary C 04/22/2021, 11:32 AM

## 2021-04-22 NOTE — Evaluation (Addendum)
Physical Therapy Evaluation Patient Details Name: Alex Mclaughlin MRN: [id number] DOB: Jun 13, 1925 Today's Date: 04/22/2021   History of Present Illness  Alex Mclaughlin is a 16yoM who comes [facility] on 5/[facility] after a fall while out to eat. Imagine revealing of Right hip intratrochanteric hip fracture, and left hip x-ray with no acute abnormality. PMH: cognitive impairment, COPD, diverticulitis, esophageal stricture, GERD, HLD, psoraiasis, LSS, CVA, HOH. Ortho consult has reviewed CT, feels fracture to be isolated to greater trochanter only, recommending conservative management and either touchdown or partial weight bearing, the patient can decide based on pain.  Clinical Impression  Pt admitted with above diagnosis. Pt currently with functional limitations due to the deficits listed below (see "PT Problem List"). Upon entry, pt in bed, awake and agreeable to participate. The pt is alert, pleasant, interactive, and able to provide only minimal info regarding prior level of function home setup, unclear of accuracy. Pt follows simple multimodal commands for limb movement, but struggles with laterality when cued, requires tactile and visual cues. Pt has no obvious pain when AA/ROM of the RLE/LLE in bed, but each attempt to perform trunk flexion, pivot to EOB is arrest by severe pain response, anxiety, and impulsivity. Pt will need improved pain control to maximize capacity to participate in basic mobility. Patient's performance this date reveals decreased ability, independence, and tolerance in performing all basic mobility required for performance of activities of daily living. Pt requires additional DME, close physical assistance, and cues for safe participate in mobility. Pt will benefit from skilled PT intervention to increase independence and safety with basic mobility in preparation for discharge to the venue listed below.      Follow Up Recommendations SNF;Supervision/Assistance - 24 hour;Supervision for  mobility/OOB (Son is not agreeable to facility placement as of day of evaluation)    Equipment Recommendations  None recommended by PT    Recommendations for Other Services       Precautions / Restrictions Precautions Precautions: Fall Restrictions Weight Bearing Restrictions: Yes RLE Weight Bearing: Partial weight bearing RLE Partial Weight Bearing Percentage or Pounds: as tolerated TDWB to PWB      Mobility  Bed Mobility Overal bed mobility: Needs Assistance Bed Mobility: Supine to Sit     Supine to sit: Total assist;+2 for physical assistance;+2 for safety/equipment     General bed mobility comments: not agreeable to perform after a couple false starts met with severe Rt low back pain. otherwise is somewhat able to follow commands ~50% of time with tactile cues.    Transfers Overall transfer level:  (Not performed; per ED RN note was standing in room last night unassissted.)                  Ambulation/Gait                Stairs            Wheelchair Mobility    Modified Rankin (Stroke Patients Only)       Balance                                             Pertinent Vitals/Pain Pain Assessment: Faces Faces Pain Scale: Hurts worst Pain Location: Right low back with attempted bed mobility; not agrravated with ranging of RLE Pain Intervention(s): Limited activity within patient's tolerance;Monitored during session;Repositioned    Home Living Family/patient expects  to be discharged to:: Private residence Living Arrangements: Alone Available Help at Discharge: Family (Wife died a few months ago, Son lives in [place], works in [place]) Type of Home: [place] Access: Stairs to enter;Ramped entrance     [place]: [place]: Environmental consultant - 2 wheels;Bedside commode;Wheelchair - manual;Shower seat;Transport chair Additional Comments: all DME belonging to wife who passed away in 01-28-23    Prior  Function Level of Independence: Needs assistance   Gait / Transfers Assistance Needed: No device for AMB at baseline; a few recent falls  ADL's / Homemaking Assistance Needed: does not perform IADL anymore; has 24/7 caregiver assistance from paid caregivers and/or son.  Comments: PLOF taken from son on telephone     Hand Dominance        Extremity/Trunk Assessment   Upper Extremity Assessment Upper Extremity Assessment: Generalized weakness    Lower Extremity Assessment Lower Extremity Assessment: Generalized weakness    Cervical / Trunk Assessment Cervical / Trunk Assessment:  (severe forward head posturing with suboccipital shortening)  Communication   Communication: Expressive difficulties;HOH (slurred speech)  Cognition Arousal/Alertness: Awake/alert Behavior During Therapy: Impulsive;Anxious;Agitated (calm and awake, but becomes agitated, anxious, and impulsive when mobilty is met with severe low back pain) Overall Cognitive Status: No family/caregiver present to determine baseline cognitive functioning                                 General Comments: Oriented to self. Unable to give location, date, or circumstances      General Comments      Exercises General Exercises - Lower Extremity Heel Slides: AAROM;Right;5 reps;Supine;Limitations Heel Slides Limitations: guarding at times, but ultimately does not help author move limb Hip ABduction/ADduction: AAROM;Right;5 reps;Supine;Limitations Hip Abduction/Adduction Limitations: Author assists when cued heavily, but remains guarded to a degree   Assessment/Plan    PT Assessment Patient needs continued PT services  PT Problem List Decreased strength;Decreased activity tolerance;Decreased mobility;Decreased cognition       PT Treatment Interventions DME instruction;Gait training;Stair training;Functional mobility training;Therapeutic activities;Therapeutic exercise;Balance training;Neuromuscular  re-education;Cognitive remediation;Patient/family education;Wheelchair mobility training    PT Goals (Current goals can be found in the Care Plan section)  Acute Rehab PT Goals PT Goal Formulation: Patient unable to participate in goal setting    Frequency 7X/week   Barriers to discharge Inaccessible home environment;Decreased caregiver support      Co-evaluation               AM-PAC PT "6 Clicks" Mobility  Outcome Measure Help needed turning from your back to your side while in a flat bed without using bedrails?: A Lot Help needed moving from lying on your back to sitting on the side of a flat bed without using bedrails?: A Lot Help needed moving to and from a bed to a chair (including a wheelchair)?: A Lot Help needed standing up from a chair using your arms (e.g., wheelchair or bedside chair)?: A Lot Help needed to walk in hospital room?: A Lot Help needed climbing 3-5 steps with a railing? : A Lot 6 Click Score: 12    End of Session   Activity Tolerance: Patient limited by pain;Treatment limited secondary to agitation Patient left: in bed;with family/visitor present;with call bell/phone within reach;with bed alarm set   PT Visit Diagnosis: Other abnormalities of gait and mobility (R26.89);Difficulty in walking, not elsewhere classified (R26.2);History of falling (Z91.81)    Time: 1191-4782 PT  Time Calculation (min) (ACUTE ONLY): 17 min   Charges:   PT Evaluation $PT Eval High Complexity: 1 High          3:09 PM, 04/22/21 Etta Grandchild, PT, DPT Physical Therapist - [facility]  [phone] ([place])    Brandt C 04/22/2021, 3:09 PM

## 2021-04-22 NOTE — Evaluation (Signed)
Occupational Therapy Evaluation Patient Details Name: Alex Mclaughlin MRN: [id number] DOB: 1925-04-03 Today's Date: 04/22/2021    History of Present Illness Alex Mclaughlin is a 49yoM who comes to [facility] on 5/18 after a fall while out to eat. Imagine revealing of Right hip intratrochanteric hip fracture, and left hip x-ray with no acute abnormality. PMH: cognitive impairment, COPD, diverticulitis, esophageal stricture, GERD, HLD, psoraiasis, LSS, CVA, HOH. Ortho consult has reviewed CT, feels fracture to be isolated to greater trochanter only, recommending conservative management and either touchdown or partial weight bearing, the patient can decide based on pain.   Clinical Impression   Pt seen for OT evaluation this date in setting of acute hospitalziation s/p fall with closed R hip fx being managed conservatively. Pt's son Alex Mclaughlin reports that pt is able to perform fxl mobility with no AD at baseline and performs several basic aspects of self care including UB dressing and toileting, without assist. States that pt has "round the clock" caregivers except for 5-9pm in which Alex Mclaughlin is with his dad. Caregivers perform all aspects of HH IADLs. Pt presents this date with some R side low back pain impacting evaluation and pt's ability to tolerate mobilization. Pt requires MOD A for UB ADLs and MAX/TOTAL A for LB ADLs and is unable to tolerate coming to EOB sitting with TOTAL A +2 on two trials. Pt saying "no no no" when attempted, grimacing, and notify's therapy team where pain is located by pointing. Overall, pt needing significantly more assistance with ADLs and ADL transfers this date. From an OT standpoint, pt would benefit most from STR f/u. OT recommend SNF upon d/c from hospital, but notes that son, Alex Mclaughlin, has POA and is very much opposed to this idea although he conveys such information in a very polite manner. Will continue to follow pt acutely and attempt to advance his ADL skills and tolerance.    Follow Up  Recommendations  SNF;Supervision/Assistance - 24 hour (pt's son adamantly against d/c to STR despite significant change in pt status)    Equipment Recommendations  Other (comment) (TBD, pt has most necessary equipment, will need to mobilize further to determine need for hospital bed.)    Recommendations for Other Services       Precautions / Restrictions Precautions Precautions: Fall Restrictions Weight Bearing Restrictions: Yes RLE Weight Bearing: Partial weight bearing RLE Partial Weight Bearing Percentage or Pounds: as tolerated TDWB to [place] "protective"      Mobility Bed Mobility Overal bed mobility: Needs Assistance Bed Mobility: Supine to Sit     Supine to sit: Total assist;+2 for physical assistance;+2 for safety/equipment     General bed mobility comments: unable to successfully come to sitting x2 attempts. Pt with significant c/o Rt low back pain with attempts to engage pt in sup to sit (mostly with attempts to elevate trunk), does not appear bothered by attempts to advance his R LE toward EOB. Somewhat attempts to particiapte/follow commands ~50% of time with tactile cues.    Transfers Overall transfer level:  (per ED RN note, pt was standing in room last night unassisted.)               General transfer comment: unable/unsafe    Balance                                           ADL either performed or assessed  with clinical judgement   ADL                                         General ADL Comments: unable to assess any ADLs in sitting 2/2 c/o pain. Pt requires MOD A for UB ADLs with cues to initiate, MAX/TOTAL A for LB d/t pain     Vision Baseline Vision/History: Wears glasses Wears Glasses: At all times Patient Visual Report: No change from baseline Additional Comments: difficult to formally assess, pt with eyes closed for much of session     Perception     Praxis      Pertinent Vitals/Pain Pain  Assessment: Faces Faces Pain Scale: Hurts worst Pain Location: Right low back with attempted bed mobility; not agrravated with ranging of RLE Pain Descriptors / Indicators: Grimacing;Guarding;Moaning Pain Intervention(s): Limited activity within patient's tolerance;Monitored during session;Repositioned     Hand Dominance     Extremity/Trunk Assessment Upper Extremity Assessment Upper Extremity Assessment: Generalized weakness   Lower Extremity Assessment Lower Extremity Assessment: Generalized weakness   Cervical / Trunk Assessment Cervical / Trunk Assessment:  (head FWD/down)   Communication Communication Communication: Expressive difficulties;HOH (slurred speech)   Cognition Arousal/Alertness: Awake/alert Behavior During Therapy: Impulsive;Anxious;Agitated (calm and awake, but becomes agitated, anxious, and impulsive when mobilty is met with severe low back pain) Overall Cognitive Status: No family/caregiver present to determine baseline cognitive functioning                                 General Comments: Oriented to self. Unable to give location, date, or circumstances   General Comments       Exercises General Exercises - Lower Extremity Heel Slides: AAROM;Right;5 reps;Supine;Limitations Heel Slides Limitations: guarding at times, but ultimately does not help author move limb Hip ABduction/ADduction: AAROM;Right;5 reps;Supine;Limitations Hip Abduction/Adduction Limitations: Author assists when cued heavily, but remains guarded to a degree Other Exercises Other Exercises: OT attempts to engage pt in ed re: role. Pt with limited reception. OT does educate pt's son and POA via telephone. Other Exercises: OT engages pt in trials to push call button to ensure he is able to reach nurse with 2/5 success (on last two trials after further education and cues)   Shoulder Instructions      Home Living Family/patient expects to be discharged to:: Private  residence Living Arrangements: Alone Available Help at Discharge: Family;Personal care attendant (son Alex Mclaughlin there from 5p-9p, otherwise has caregivers around the clock) Type of Home: House Home Access: Stairs to enter;Ramped entrance     Home Layout: One level               Home Equipment: Environmental consultant - 2 wheels;Bedside commode;Wheelchair - manual;Shower seat;Transport chair   Additional Comments: all DME belonging to wife who passed away in 02/03/2023      Prior Functioning/Environment Level of Independence: Needs assistance  Gait / Transfers Assistance Needed: No device for AMB at baseline; a few recent falls ADL's / Homemaking Assistance Needed: does not perform IADL anymore; has 24/7 caregiver assistance from paid caregivers and/or son. Able to perform some basic ADLs such as toileting, grooming, UB dressing, and self-feeding. Required assist for bathing   Comments: PLOF taken from son on telephone        OT Problem List: Decreased strength;Decreased activity tolerance;Impaired balance (sitting and/or standing);Decreased  cognition;Decreased knowledge of use of DME or AE;Decreased knowledge of precautions;Pain      OT Treatment/Interventions: Self-care/ADL training;DME and/or AE instruction;Therapeutic activities;Balance training;Therapeutic exercise;Patient/family education    OT Goals(Current goals can be found in the care plan section) Acute Rehab OT Goals Patient Stated Goal: for him to get stronger and for me to take him home OT Goal Formulation: With family Time For Goal Achievement: 05/06/21 Potential to Achieve Goals: Fair ADL Goals Pt Will Perform Grooming: with min assist;sitting Pt Will Transfer to Toilet: with mod assist;with max assist;stand pivot transfer;bedside commode Pt/caregiver will Perform Home Exercise Program: Increased strength;Both right and left upper extremity;With minimal assist Additional ADL Goal #1: pt will tolerate further mobilization, at  least sup to sit with MAX A to further allow for development of OT POC.  OT Frequency: Min 1X/week   Barriers to D/C:            Co-evaluation PT/OT/SLP Co-Evaluation/Treatment: Yes Reason for Co-Treatment: Complexity of the patient's impairments (multi-system involvement);Necessary to address cognition/behavior during functional activity;To address functional/ADL transfers PT goals addressed during session: Mobility/safety with mobility OT goals addressed during session: ADL's and self-care      AM-PAC OT "6 Clicks" Daily Activity     Outcome Measure Help from another person eating meals?: A Little Help from another person taking care of personal grooming?: A Little Help from another person toileting, which includes using toliet, bedpan, or urinal?: A Lot Help from another person bathing (including washing, rinsing, drying)?: A Lot Help from another person to put on and taking off regular upper body clothing?: A Lot Help from another person to put on and taking off regular lower body clothing?: A Lot 6 Click Score: 14   End of Session Nurse Communication: Mobility status;Other (comment) (pain)  Activity Tolerance: Patient limited by pain Patient left: in bed;with call bell/phone within reach;with bed alarm set  OT Visit Diagnosis: Unsteadiness on feet (R26.81);Muscle weakness (generalized) (M62.81);History of falling (Z91.81);Pain Pain - Right/Left: Right Pain - part of body:  (lower back)                Time: 8299-3716 OT Time Calculation (min): 22 min Charges:  OT General Charges $OT Visit: 1 Visit OT Evaluation $OT Eval Moderate Complexity: 1 Mod OT Treatments $Self Care/Home Management : 8-22 mins  Gerrianne Scale, MS, OTR/L ascom [phone] 04/22/21, 5:34 PM

## 2021-04-22 NOTE — Plan of Care (Signed)
  Problem: Education: Goal: Knowledge of General Education information will improve Description: Including pain rating scale, medication(s)/side effects and non-pharmacologic comfort measures Outcome: Progressing   Problem: Health Behavior/Discharge Planning: Goal: Ability to manage health-related needs will improve Outcome: Progressing   Problem: Clinical Measurements: Goal: Ability to maintain clinical measurements within normal limits will improve Outcome: Progressing Goal: Will remain free from infection Outcome: Progressing Goal: Diagnostic test results will improve Outcome: Progressing Goal: Respiratory complications will improve Outcome: Progressing Goal: Cardiovascular complication will be avoided Outcome: Progressing   Problem: Nutrition: Goal: Adequate nutrition will be maintained Outcome: Progressing   Problem: Coping: Goal: Level of anxiety will decrease Outcome: Progressing   Problem: Elimination: Goal: Will not experience complications related to bowel motility Outcome: Progressing Goal: Will not experience complications related to urinary retention Outcome: Progressing   Problem: Pain Managment: Goal: General experience of comfort will improve Outcome: Progressing   Problem: Safety: Goal: Ability to remain free from injury will improve Outcome: Progressing   Problem: Skin Integrity: Goal: Risk for impaired skin integrity will decrease Outcome: Progressing   Problem: Education: Goal: Knowledge of the prescribed therapeutic regimen will improve Outcome: Progressing Goal: Understanding of discharge needs will improve Outcome: Progressing Goal: Individualized Educational Video(s) Outcome: Progressing   Problem: Activity: Goal: Ability to avoid complications of mobility impairment will improve Outcome: Progressing Goal: Ability to tolerate increased activity will improve Outcome: Progressing   Problem: Pain Management: Goal: Pain level will  decrease with appropriate interventions Outcome: Progressing

## 2021-04-22 NOTE — Progress Notes (Signed)
PROGRESS NOTE    Alex McmurrayJack D Mclaughlin  ONG:[id number] DOB: 1925-11-04 DOA: 04/20/2021 PCP: Alex Mclaughlin, James, MD   Assessment & Plan:   Principal Problem:   Closed intertrochanteric fracture of hip, right, initial encounter [facility](HCC) Active Problems:   Hard of hearing   Macrocytic anemia   Right hip fracture: of the greater trochanter w/o extension into the intertrochanteric region. Secondary to fall at home. No surgical intervention needed as per ortho. Norco, dilaudid prn for pain. Ortho surg recs apprec. PT/OT consulted  Macrocytic anemia: folate is WNL. B12 is low, so will start B12 supplement   Likely cognitive impairment: continue w/ supportive care. Noted w/ recent PCP appointment   Possible CHF: as noted on CXR, No hx of CHF. IV lasix x1. Echo ordered. Unknown systolic vs diastolic vs combined   DVT prophylaxis: lovenox  Code Status: full  Family Communication: called pt's son, Trey PaulaJeff, but no answer so I left a message  Disposition Plan: depends on PT/OT recs  Level of care: Med-Surg   Status is: Inpatient  Remains inpatient appropriate because:Unsafe d/c plan, IV treatments appropriate due to intensity of illness or inability to take PO and Inpatient level of care appropriate due to severity of illness   Dispo: The patient is from: Home              Anticipated d/c is to: SNF              Patient currently is not medically stable to d/c.   Difficult to place patient : unclear           Consultants:   Ortho surg    Procedures:    Antimicrobials:    Subjective: Pt is confused and answering "no" to all questions  Objective: Vitals:   04/22/21 0300 04/22/21 0430 04/22/21 0530 04/22/21 0600  BP: 119/71 104/66 129/71 133/72  Pulse: (!) 103   (!) 102  Resp: 18  18 18   Temp:      TempSrc:      SpO2: 93%  93% 92%  Weight:      Height:       No intake or output data in the 24 hours ending 04/22/21 0811 Filed Weights   04/20/21 2052  Weight: 58.1 kg     Examination:  General exam: Appears uncomfortable. Frail appearing  Respiratory system: course breath sounds b/l  Cardiovascular system: S1/S2+. No rubs or clicks  Gastrointestinal system: Abd is soft, NT, ND & hypoactive bowel sounds  Central nervous system: Disoriented. Moves all extremities  Psychiatry: judgement and insight appear abnormal. Flat mood and affect     Data Reviewed: I have personally reviewed following labs and imaging studies  CBC: Recent Labs  Lab 04/20/21 2054 04/21/21 0512  WBC 9.0 10.3  NEUTROABS 6.6  --   HGB 11.2* 11.0*  HCT 33.8* 32.2*  MCV 100.6* 98.2  PLT 217 196   Basic Metabolic Panel: Recent Labs  Lab 04/20/21 2054  NA 136  K 3.9  CL 101  CO2 23  GLUCOSE 281*  BUN 17  CREATININE 1.08  CALCIUM 8.5*   GFR: Estimated Creatinine Clearance: 34.4 mL/min (by C-G formula based on SCr of 1.08 mg/dL). Liver Function Tests: Recent Labs  Lab 04/20/21 2054  AST 16  ALT 8  ALKPHOS 85  BILITOT 0.5  PROT 7.0  ALBUMIN 3.5   No results for input(s): LIPASE, AMYLASE in the last 168 hours. No results for input(s): AMMONIA in the last 168 hours. Coagulation  Profile: Recent Labs  Lab 04/21/21 0512  INR 1.0   Cardiac Enzymes: No results for input(s): CKTOTAL, CKMB, CKMBINDEX, TROPONINI in the last 168 hours. BNP (last 3 results) No results for input(s): PROBNP in the last 8760 hours. HbA1C: No results for input(s): HGBA1C in the last 72 hours. CBG: No results for input(s): GLUCAP in the last 168 hours. Lipid Profile: No results for input(s): CHOL, HDL, LDLCALC, TRIG, CHOLHDL, LDLDIRECT in the last 72 hours. Thyroid Function Tests: No results for input(s): TSH, T4TOTAL, FREET4, T3FREE, THYROIDAB in the last 72 hours. Anemia Panel: Recent Labs    04/20/21 2333  FOLATE 9.4   Sepsis Labs: No results for input(s): PROCALCITON, LATICACIDVEN in the last 168 hours.  Recent Results (from the past 240 hour(s))  Resp Panel by RT-PCR  (Flu A&B, Covid) Nasopharyngeal Swab     Status: None   Collection Time: 04/20/21  9:45 PM   Specimen: Nasopharyngeal Swab; Nasopharyngeal(NP) swabs in vial transport medium  Result Value Ref Range Status   SARS Coronavirus 2 by RT PCR NEGATIVE NEGATIVE Final    Comment: (NOTE) SARS-CoV-2 target nucleic acids are NOT DETECTED.  The SARS-CoV-2 RNA is generally detectable in upper respiratory specimens during the acute phase of infection. The lowest concentration of SARS-CoV-2 viral copies this assay can detect is 138 copies/mL. A negative result does not preclude SARS-Cov-2 infection and should not be used as the sole basis for treatment or other patient management decisions. A negative result may occur with  improper specimen collection/handling, submission of specimen other than nasopharyngeal swab, presence of viral mutation(s) within the areas targeted by this assay, and inadequate number of viral copies(<138 copies/mL). A negative result must be combined with clinical observations, patient history, and epidemiological information. The expected result is Negative.  Fact Sheet for Patients:  BloggerCourse.com  Fact Sheet for Healthcare Providers:  SeriousBroker.it  This test is no t yet approved or cleared by the [place] FDA and  has been authorized for detection and/or diagnosis of SARS-CoV-2 by FDA under an Emergency Use Authorization (EUA). This EUA will remain  in effect (meaning this test can be used) for the duration of the COVID-19 declaration under Section 564(b)(1) of the Act, 21 U.S.C.section 360bbb-3(b)(1), unless the authorization is terminated  or revoked sooner.       Influenza A by PCR NEGATIVE NEGATIVE Final   Influenza B by PCR NEGATIVE NEGATIVE Final    Comment: (NOTE) The Xpert Xpress SARS-CoV-2/FLU/RSV plus assay is intended as an aid in the diagnosis of influenza from Nasopharyngeal swab specimens  and should not be used as a sole basis for treatment. Nasal washings and aspirates are unacceptable for Xpert Xpress SARS-CoV-2/FLU/RSV testing.  Fact Sheet for Patients: BloggerCourse.com  Fact Sheet for Healthcare Providers: SeriousBroker.it  This test is not yet approved or cleared by the [place] FDA and has been authorized for detection and/or diagnosis of SARS-CoV-2 by FDA under an Emergency Use Authorization (EUA). This EUA will remain in effect (meaning this test can be used) for the duration of the COVID-19 declaration under Section 564(b)(1) of the Act, 21 U.S.C. section 360bbb-3(b)(1), unless the authorization is terminated or revoked.  Performed at [facility], [address] [place], [place] [address]          Radiology Studies: CT Head Wo Contrast  Result Date: 04/20/2021 CLINICAL DATA:  Fall EXAM: CT HEAD WITHOUT CONTRAST TECHNIQUE: Contiguous axial images were obtained from the base of the skull through the vertex  without intravenous contrast. COMPARISON:  None. FINDINGS: Brain: Right temporal and parietal encephalomalacia is unchanged. Multiple old small vessel infarcts. Generalized atrophy. There is periventricular hypoattenuation compatible with chronic microvascular disease. No acute hemorrhage. Vascular: No abnormal hyperdensity of the major intracranial arteries or dural venous sinuses. No intracranial atherosclerosis. Skull: The visualized skull base, calvarium and extracranial soft tissues are normal. Sinuses/Orbits: No fluid levels or advanced mucosal thickening of the visualized paranasal sinuses. No mastoid or middle ear effusion. The orbits are normal. IMPRESSION: 1. No acute intracranial abnormality. 2. Unchanged right temporal and parietal encephalomalacia and multiple old small vessel infarcts. Electronically Signed   By: Deatra Robinson M.D.   On: 04/20/2021 22:26   CT Cervical Spine Wo  Contrast  Result Date: 04/20/2021 CLINICAL DATA:  Fall EXAM: CT CERVICAL SPINE WITHOUT CONTRAST TECHNIQUE: Multidetector CT imaging of the cervical spine was performed without intravenous contrast. Multiplanar CT image reconstructions were also generated. COMPARISON:  None. FINDINGS: Alignment: No static subluxation. Facets are aligned. Occipital condyles and the lateral masses of C1 and C2 are normally approximated. Skull base and vertebrae: No acute fracture. Soft tissues and spinal canal: No prevertebral fluid or swelling. No visible canal hematoma. Disc levels: Multilevel degenerative change without high-grade spinal canal stenosis. Upper chest: No pneumothorax, pulmonary nodule or pleural effusion. Other: Normal visualized paraspinal cervical soft tissues. IMPRESSION: No acute fracture or static subluxation of the cervical spine. Electronically Signed   By: Deatra Robinson M.D.   On: 04/20/2021 22:31   CT Hip Right Wo Contrast  Result Date: 04/21/2021 CLINICAL DATA:  Fall, right hip fracture EXAM: CT OF THE RIGHT HIP WITHOUT CONTRAST TECHNIQUE: Multidetector CT imaging of the right hip was performed according to the standard protocol. Multiplanar CT image reconstructions were also generated. COMPARISON:  Plain radiographs 04/20/2021 FINDINGS: Bones/Joint/Cartilage There is an acute, minimally comminuted fracture of the posterosuperior aspect of the greater trochanter. The fracture plane does not extend into the intratrochanteric region or the femoral neck. The femoral head is still seated within the right acetabulum. The visualized sacrum and the right ilium are intact. There is mild right hip degenerative arthritis noted. Ligaments Suboptimally assessed by CT. Muscles and Tendons The fracture fragments appear medial and superior to the insertion of the a gluteal tendons upon the greater trochanter. Iliopsoas and hamstring tendons are intact. Mild fatty infiltration of the a gluteal and iliopsoas and  adductor musculature. Soft tissues A a small inter fascial hematoma is seen adjacent to the fractured greater trochanter measuring at least 2.3 by 6.0 cm in greatest dimension. Small fat containing inguinal hernia is incidentally noted. Vascular calcifications are noted within the visualized lower extremity arterial inflow and outflow. IMPRESSION: Acute, mildly comminuted fracture of the greater trochanter of the right hip without extension of the fracture plane into the intratrochanteric region or femoral neck. No dislocation. The fracture fragment appears separate from the insertion of the gluteal tendons. Small inter fascial hematoma adjacent to the fractured greater trochanter. Electronically Signed   By: Helyn Numbers MD   On: 04/21/2021 00:28   DG Chest Portable 1 View  Result Date: 04/20/2021 CLINICAL DATA:  Weakness EXAM: PORTABLE CHEST 1 VIEW COMPARISON:  02/11/2016 FINDINGS: Cardiac shadow is within normal limits. Previously seen nodular density in the left upper lobe is not well appreciated. Calcification is noted in the left base stable in appearance from the prior exam. Increased vascular congestion and interstitial edema is noted. No bony abnormality is noted. IMPRESSION: Mild changes of CHF. Electronically  Signed   By: Alcide Clever M.D.   On: 04/20/2021 21:28   DG HIP UNILAT WITH PELVIS 2-3 VIEWS LEFT  Result Date: 04/20/2021 CLINICAL DATA:  Bilateral hip pain and weakness, initial encounter EXAM: DG HIP (WITH OR WITHOUT PELVIS) 2V LEFT COMPARISON:  None. FINDINGS: There is no evidence of hip fracture or dislocation. There is no evidence of arthropathy or other focal bone abnormality. IMPRESSION: No acute abnormality noted. Electronically Signed   By: Alcide Clever M.D.   On: 04/20/2021 22:34   DG HIP UNILAT WITH PELVIS 2-3 VIEWS RIGHT  Result Date: 04/20/2021 CLINICAL DATA:  Bilateral hip pain and weakness, no known injury, initial encounter EXAM: DG HIP (WITH OR WITHOUT PELVIS) 3V RIGHT  COMPARISON:  None. FINDINGS: Pelvic ring is intact. Degenerative changes of the lumbar spine are seen. Lucency is noted in the greater trochanter consistent with an incomplete intratrochanteric fracture. No other focal abnormality is noted. IMPRESSION: Incomplete intratrochanteric fracture on the right. Electronically Signed   By: Alcide Clever M.D.   On: 04/20/2021 22:34        Scheduled Meds: . enoxaparin (LOVENOX) injection  40 mg Subcutaneous Q24H   Continuous Infusions:   LOS: 0 days    Time spent: 30 mins     Charise Killian, MD [facility] Pager [phone]  If 7PM-7AM, please contact night-coverage  04/22/2021, 8:11 AM

## 2021-04-23 ENCOUNTER — Inpatient Hospital Stay: Admit: 2021-04-23 | Payer: Medicare Other

## 2021-04-23 DIAGNOSIS — S72141A Displaced intertrochanteric fracture of right femur, initial encounter for closed fracture: Secondary | ICD-10-CM | POA: Diagnosis not present

## 2021-04-23 DIAGNOSIS — D539 Nutritional anemia, unspecified: Secondary | ICD-10-CM | POA: Diagnosis not present

## 2021-04-23 DIAGNOSIS — R4189 Other symptoms and signs involving cognitive functions and awareness: Secondary | ICD-10-CM | POA: Diagnosis not present

## 2021-04-23 LAB — CBC
HCT: 31.9 % — ABNORMAL LOW (ref 39.0–52.0)
Hemoglobin: 11.1 g/dL — ABNORMAL LOW (ref 13.0–17.0)
MCH: 33.6 pg (ref 26.0–34.0)
MCHC: 34.8 g/dL (ref 30.0–36.0)
MCV: 96.7 fL (ref 80.0–100.0)
Platelets: 217 10*3/uL (ref 150–400)
RBC: 3.3 MIL/uL — ABNORMAL LOW (ref 4.22–5.81)
RDW: 13.8 % (ref 11.5–15.5)
WBC: 11.1 10*3/uL — ABNORMAL HIGH (ref 4.0–10.5)
nRBC: 0 % (ref 0.0–0.2)

## 2021-04-23 LAB — BASIC METABOLIC PANEL
Anion gap: 11 (ref 5–15)
BUN: 22 mg/dL (ref 8–23)
CO2: 25 mmol/L (ref 22–32)
Calcium: 9 mg/dL (ref 8.9–10.3)
Chloride: 101 mmol/L (ref 98–111)
Creatinine, Ser: 0.95 mg/dL (ref 0.61–1.24)
GFR, Estimated: >60 mL/min (ref >60)
Glucose, Bld: 113 mg/dL — ABNORMAL HIGH (ref 70–99)
Potassium: 3.6 mmol/L (ref 3.5–5.1)
Sodium: 137 mmol/L (ref 135–145)

## 2021-04-23 MED ORDER — HALOPERIDOL LACTATE 5 MG/ML IJ SOLN: 5.0000 mg | Freq: Once | INTRAMUSCULAR | Status: DC

## 2021-04-23 NOTE — Progress Notes (Signed)
PROGRESS NOTE    Alex Mclaughlin  XHB:[id number] DOB: Aug 20, 1925 DOA: 04/20/2021 PCP: Jerl Mina, MD   Assessment & Plan:   Principal Problem:   Closed intertrochanteric fracture of hip, right, initial encounter [facility]) Active Problems:   Hard of hearing   Macrocytic anemia   Hip fracture, right (HCC)   Right hip fracture: of the greater trochanter w/o extension into the intertrochanteric region. Secondary to fall at home. No surgical intervention needed as per ortho. Norco, dilaudid prn for pain. Ortho surg recs apprec. PT/OT consulted  Macrocytic anemia: folate is WNL. B12 is low, continue on B12 supplement    Likely cognitive impairment: continue w/ supportive care. Noted w/ recent PCP appt  Possible CHF: as noted on CXR. No hx of CHF, so unknown systolic vs diastolic vs combined. S/p IV lasix x1. Echo ordered but not done yet     DVT prophylaxis: lovenox  Code Status: full  Family Communication: called pt's son, Roe Coombs, and no answer but I left a message  Disposition Plan: likely d/c to SNF   Level of care: Med-Surg   Status is: Inpatient  Remains inpatient appropriate because:Unsafe d/c plan, IV treatments appropriate due to intensity of illness or inability to take PO and Inpatient level of care appropriate due to severity of illness   Dispo: The patient is from: Home              Anticipated d/c is to: SNF              Patient currently is not medically stable to d/c.   Difficult to place patient : unclear       Consultants:   Ortho surg    Procedures:    Antimicrobials:    Subjective: Pt is still not answering any questions   Objective: Vitals:   04/22/21 1606 04/22/21 2004 04/22/21 2325 04/23/21 0403  BP: 106/87 125/89 (!) 142/91 140/82  Pulse: (!) 107 (!) 102 (!) 104 70  Resp:  18 18 18   Temp:  97.6 F (36.4 C) 98.2 F (36.8 C) 98.2 F (36.8 C)  TempSrc:      SpO2:  94% 96% 98%  Weight:      Height:        Intake/Output Summary  (Last 24 hours) at 04/23/2021 0719 Last data filed at 04/23/2021 0335 Gross per 24 hour  Intake 789.87 ml  Output 800 ml  Net -10.13 ml   Filed Weights   04/20/21 2052  Weight: 58.1 kg    Examination:  General exam: Appears restless and frail appearing  Respiratory system: diminished breath sounds b/l   Cardiovascular system: S1 & S2+. No clicks or rubs Gastrointestinal system: Abd is soft, NT, ND & hypoactive bowel sounds   Central nervous system: Disoriented. Moves all extremities  Psychiatry: judgement and insight appear abnormal. Flat mood and affect    Data Reviewed: I have personally reviewed following labs and imaging studies  CBC: Recent Labs  Lab 04/20/21 2054 04/21/21 0512 04/22/21 0947 04/23/21 0555  WBC 9.0 10.3 12.6* 11.1*  NEUTROABS 6.6  --   --   --   HGB 11.2* 11.0* 11.2* 11.1*  HCT 33.8* 32.2* 32.6* 31.9*  MCV 100.6* 98.2 97.0 96.7  PLT 217 196 198 217   Basic Metabolic Panel: Recent Labs  Lab 04/20/21 2054 04/22/21 0947 04/23/21 0555  NA 136 137 137  K 3.9 3.8 3.6  CL 101 102 101  CO2 23 24 25   GLUCOSE 281* 113*  113*  BUN 17 18 22   CREATININE 1.08 0.78 0.95  CALCIUM 8.5* 9.0 9.0   GFR: Estimated Creatinine Clearance: 39.1 mL/min (by C-G formula based on SCr of 0.95 mg/dL). Liver Function Tests: Recent Labs  Lab 04/20/21 2054  AST 16  ALT 8  ALKPHOS 85  BILITOT 0.5  PROT 7.0  ALBUMIN 3.5   No results for input(s): LIPASE, AMYLASE in the last 168 hours. No results for input(s): AMMONIA in the last 168 hours. Coagulation Profile: Recent Labs  Lab 04/21/21 0512  INR 1.0   Cardiac Enzymes: No results for input(s): CKTOTAL, CKMB, CKMBINDEX, TROPONINI in the last 168 hours. BNP (last 3 results) No results for input(s): PROBNP in the last 8760 hours. HbA1C: No results for input(s): HGBA1C in the last 72 hours. CBG: No results for input(s): GLUCAP in the last 168 hours. Lipid Profile: No results for input(s): CHOL, HDL,  LDLCALC, TRIG, CHOLHDL, LDLDIRECT in the last 72 hours. Thyroid Function Tests: No results for input(s): TSH, T4TOTAL, FREET4, T3FREE, THYROIDAB in the last 72 hours. Anemia Panel: Recent Labs    04/20/21 2333 04/22/21 0947  VITAMINB12  --  113*  FOLATE 9.4  --    Sepsis Labs: No results for input(s): PROCALCITON, LATICACIDVEN in the last 168 hours.  Recent Results (from the past 240 hour(s))  Resp Panel by RT-PCR (Flu A&B, Covid) Nasopharyngeal Swab     Status: None   Collection Time: 04/20/21  9:45 PM   Specimen: Nasopharyngeal Swab; Nasopharyngeal(NP) swabs in vial transport medium  Result Value Ref Range Status   SARS Coronavirus 2 by RT PCR NEGATIVE NEGATIVE Final    Comment: (NOTE) SARS-CoV-2 target nucleic acids are NOT DETECTED.  The SARS-CoV-2 RNA is generally detectable in upper respiratory specimens during the acute phase of infection. The lowest concentration of SARS-CoV-2 viral copies this assay can detect is 138 copies/mL. A negative result does not preclude SARS-Cov-2 infection and should not be used as the sole basis for treatment or other patient management decisions. A negative result may occur with  improper specimen collection/handling, submission of specimen other than nasopharyngeal swab, presence of viral mutation(s) within the areas targeted by this assay, and inadequate number of viral copies(<138 copies/mL). A negative result must be combined with clinical observations, patient history, and epidemiological information. The expected result is Negative.  Fact Sheet for Patients:  04/22/21  Fact Sheet for Healthcare Providers:  BloggerCourse.com  This test is no t yet approved or cleared by the [employer].[place] FDA and  has been authorized for detection and/or diagnosis of SARS-CoV-2 by FDA under an Emergency Use Authorization (EUA). This EUA will remain  in effect (meaning this test can be used)  for the duration of the COVID-19 declaration under Section 564(b)(1) of the Act, 21 U.S.C.section 360bbb-3(b)(1), unless the authorization is terminated  or revoked sooner.       Influenza A by PCR NEGATIVE NEGATIVE Final   Influenza B by PCR NEGATIVE NEGATIVE Final    Comment: (NOTE) The Xpert Xpress SARS-CoV-2/FLU/RSV plus assay is intended as an aid in the diagnosis of influenza from Nasopharyngeal swab specimens and should not be used as a sole basis for treatment. Nasal washings and aspirates are unacceptable for Xpert Xpress SARS-CoV-2/FLU/RSV testing.  Fact Sheet for Patients: [place]  Fact Sheet for Healthcare Providers: BloggerCourse.com  This test is not yet approved or cleared by the [employer].[place] FDA and has been authorized for detection and/or diagnosis of SARS-CoV-2 by FDA under an Emergency  Use Authorization (EUA). This EUA will remain in effect (meaning this test can be used) for the duration of the COVID-19 declaration under Section 564(b)(1) of the Act, 21 U.S.C. section 360bbb-3(b)(1), unless the authorization is terminated or revoked.  Performed at [facility], [address], [place], [place] [address]          Radiology Studies: No results found.      Scheduled Meds: . enoxaparin (LOVENOX) injection  40 mg Subcutaneous Q24H  . vitamin B-12  1,000 mcg Oral Daily   Continuous Infusions: . dextrose 5 % and 0.9% NaCl 50 mL/hr at 04/23/21 0335     LOS: 1 day    Time spent: 31 mins     Charise Killian, MD [facility] Pager [phone]  If 7PM-7AM, please contact night-coverage  04/23/2021, 7:19 AM

## 2021-04-23 NOTE — Progress Notes (Signed)
Physical Therapy Treatment Patient Details Name: Alex Mclaughlin MRN: [id number] DOB: 1925-06-07 Today's Date: 04/23/2021    History of Present Illness Sharod Petsch is a 89yoM who comes to [facility] on 5/18 after a fall while out to eat. Imagine revealing of Right hip intratrochanteric hip fracture, and left hip x-ray with no acute abnormality. PMH: cognitive impairment, COPD, diverticulitis, esophageal stricture, GERD, HLD, psoraiasis, LSS, CVA, HOH. Ortho consult has reviewed CT, feels fracture to be isolated to greater trochanter only, recommending conservative management and either touchdown or partial weight bearing, the patient can decide based on pain.    PT Comments    Pt in room having vitals assess with NA. RN reports pt has gotten his painmeds and is more alert/awake now. Pt reports he needs to go the bathroom. He is somewhat impulsive has difficulty waiting for author to clear a path to the bathroom prior to starting mobility to EOB. Pt needs minA to come to EOB from elevated HOB. Pt rises to standing with minGaurd assist and RW, he appears to have adequate pain control in transfers, stance and AMB. Mentation is not adequate to comprehend his PWB status, but he is accepting of the RW. Pt AMB ~66ft c RW over several minutes, somewhat disoriented, confused, and driven exclusively by the desire to pee and smoke a cigarette. Pt assisted back into bed, declined any further interest in AMB further. Reports he does not want his hear aids in right now (charging on counter). He denies interest in nicotine patch. Will continues to follow. Author reached out to Son via telephone to give updates, LVM, have not heard back at the time of writing this note.       Follow Up Recommendations  SNF;Supervision/Assistance - 24 hour;Supervision for mobility/OOB (family undecided regarding going to facility)     Equipment Recommendations  None recommended by PT    Recommendations for Other Services        Precautions / Restrictions Precautions Precautions: Fall Restrictions RLE Weight Bearing: Partial weight bearing    Mobility  Bed Mobility Overal bed mobility: Needs Assistance Bed Mobility: Sit to Supine;Supine to Sit     Supine to sit: Min assist;HOB elevated Sit to supine: Mod assist        Transfers Overall transfer level: Needs assistance Equipment used: Rolling walker (2 wheeled) Transfers: Sit to/from Stand Sit to Stand: Min guard            Ambulation/Gait Ambulation/Gait assistance: Min guard;Min assist Gait Distance (Feet): 40 Feet Assistive device: Rolling walker (2 wheeled)       General Gait Details: meandering with frequent pauses,looking for a BR; once in the hall, explained that he has a catheter in place need not go to toilet. Pt AMB back to bed. Author managed IV pole, RW when furniture is in the way. All in all up standing for about 10-12 minutes   Stairs             Wheelchair Mobility    Modified Rankin (Stroke Patients Only)       Balance Overall balance assessment: Needs assistance;History of Falls                                          Cognition Arousal/Alertness: Awake/alert Behavior During Therapy: Impulsive;WFL for tasks assessed/performed  General Comments: forgetful, reminded 3-4x that he has a catheter on, also that he is in a hospital and cannot smoke. then explained that he can't go outside to smoke either.      Exercises      General Comments        Pertinent Vitals/Pain Faces Pain Scale: No hurt    Home Living                      Prior Function            PT Goals (current goals can now be found in the care plan section) Acute Rehab PT Goals Patient Stated Goal: for him to get stronger and for me to take him home PT Goal Formulation: Patient unable to participate in goal setting Progress towards PT goals: Progressing  toward goals    Frequency    7X/week      PT Plan Current plan remains appropriate    Co-evaluation              AM-PAC PT "6 Clicks" Mobility   Outcome Measure  Help needed turning from your back to your side while in a flat bed without using bedrails?: A Lot Help needed moving from lying on your back to sitting on the side of a flat bed without using bedrails?: A Lot Help needed moving to and from a bed to a chair (including a wheelchair)?: A Lot Help needed standing up from a chair using your arms (e.g., wheelchair or bedside chair)?: A Lot Help needed to walk in hospital room?: A Lot Help needed climbing 3-5 steps with a railing? : A Lot 6 Click Score: 12    End of Session Equipment Utilized During Treatment: Gait belt Activity Tolerance: No increased pain;Patient limited by fatigue Patient left: in bed;with family/visitor present;with bed alarm set Nurse Communication: Mobility status PT Visit Diagnosis: Other abnormalities of gait and mobility (R26.89);Difficulty in walking, not elsewhere classified (R26.2);History of falling (Z91.81)     Time: 6712-4580 PT Time Calculation (min) (ACUTE ONLY): 23 min  Charges:  $Therapeutic Activity: 23-37 mins                     12:01 PM, 04/23/21 Rosamaria Lints, PT, DPT Physical Therapist - [facility]  [phone] (ASCOM)     Joby Hershkowitz C 04/23/2021, 11:56 AM

## 2021-04-24 ENCOUNTER — Inpatient Hospital Stay (HOSPITAL_COMMUNITY)
Admit: 2021-04-24 | Discharge: 2021-04-24 | Disposition: A | Discharge status: Home / Self Care | Payer: Medicare Other | Attending: Internal Medicine | Admitting: Internal Medicine

## 2021-04-24 DIAGNOSIS — I5041 Acute combined systolic (congestive) and diastolic (congestive) heart failure: Secondary | ICD-10-CM

## 2021-04-24 DIAGNOSIS — R4189 Other symptoms and signs involving cognitive functions and awareness: Secondary | ICD-10-CM | POA: Diagnosis not present

## 2021-04-24 DIAGNOSIS — S72141A Displaced intertrochanteric fracture of right femur, initial encounter for closed fracture: Secondary | ICD-10-CM | POA: Diagnosis not present

## 2021-04-24 DIAGNOSIS — D539 Nutritional anemia, unspecified: Secondary | ICD-10-CM | POA: Diagnosis not present

## 2021-04-24 LAB — CBC
HCT: 31.3 % — ABNORMAL LOW (ref 39.0–52.0)
Hemoglobin: 10.6 g/dL — ABNORMAL LOW (ref 13.0–17.0)
MCH: 33.4 pg (ref 26.0–34.0)
MCHC: 33.9 g/dL (ref 30.0–36.0)
MCV: 98.7 fL (ref 80.0–100.0)
Platelets: 194 10*3/uL (ref 150–400)
RBC: 3.17 MIL/uL — ABNORMAL LOW (ref 4.22–5.81)
RDW: 14.3 % (ref 11.5–15.5)
WBC: 8.1 10*3/uL (ref 4.0–10.5)
nRBC: 0 % (ref 0.0–0.2)

## 2021-04-24 LAB — ECHOCARDIOGRAM COMPLETE
AR max vel: 1.39 cm2
AV Peak grad: 9.6 mmHg
Ao pk vel: 1.55 m/s
Area-P 1/2: 3.51 cm2
Height: 68 in
S' Lateral: 2.14 cm
Weight: 2048 oz

## 2021-04-24 LAB — BASIC METABOLIC PANEL
Anion gap: 7 (ref 5–15)
BUN: 26 mg/dL — ABNORMAL HIGH (ref 8–23)
CO2: 27 mmol/L (ref 22–32)
Calcium: 8.7 mg/dL — ABNORMAL LOW (ref 8.9–10.3)
Chloride: 108 mmol/L (ref 98–111)
Creatinine, Ser: 0.9 mg/dL (ref 0.61–1.24)
GFR, Estimated: >60 mL/min (ref >60)
Glucose, Bld: 110 mg/dL — ABNORMAL HIGH (ref 70–99)
Potassium: 3.6 mmol/L (ref 3.5–5.1)
Sodium: 142 mmol/L (ref 135–145)

## 2021-04-24 NOTE — Progress Notes (Signed)
PROGRESS NOTE    Alex Mclaughlin  TDV:[id number] DOB: [birth year] DOA: 04/20/2021 PCP: Jerl Mina, MD   Assessment & Plan:   Principal Problem:   Closed intertrochanteric fracture of hip, right, initial encounter [facility]) Active Problems:   Hard of hearing   Macrocytic anemia   Hip fracture, right (HCC)   Right hip fracture: of the greater trochanter w/o extension into the intertrochanteric region. Secondary to fall at home. No surgical intervention needed as per ortho. Norco, dilaudid prn for pain. Ortho surg recs apprec. PT/OT recs SNF but pt's family refused SNF but agreed to home health   Macrocytic anemia: secondary to B12 deficiency. Continue on B12 supplement. Folate is WNL    Likely cognitive impairment: continue w/ supportive care  Possible CHF: as noted on CXR. No hx of CHF, so unknown systolic vs diastolic vs combined. S/p IV lasix x1. Echo done but not read yet     DVT prophylaxis: lovenox  Code Status: full  Family Communication: called pt's son, Roe Coombs, and no answer but I left a message. Discussed pt's care w/ pt's son, Trey Paula and answered his questions  Disposition Plan: likely d/c to SNF   Level of care: Med-Surg   Status is: Inpatient  Remains inpatient appropriate because:Unsafe d/c plan, IV treatments appropriate due to intensity of illness or inability to take PO and Inpatient level of care appropriate due to severity of illness ,waiting on echo to be read. Likely d/c home tomorrow   Dispo: The patient is from: Home              Anticipated d/c is to: home w/ home health              Patient currently is not medically stable to d/c.   Difficult to place patient : unclear       Consultants:   Ortho surg    Procedures:    Antimicrobials:    Subjective: Pt denies any pain.  Objective: Vitals:   04/23/21 1300 04/23/21 1657 04/23/21 2128 04/24/21 0531  BP: 101/61 (!) 100/56 122/68 119/69  Pulse: 87 69 65 77  Resp: 18 15 20 17   Temp: (!)  97.5 F (36.4 C) 97.7 F (36.5 C) 98.9 F (37.2 C) (!) 97.5 F (36.4 C)  TempSrc:  Oral Oral   SpO2: 96% 97% 100% 97%  Weight:      Height:        Intake/Output Summary (Last 24 hours) at 04/24/2021 0725 Last data filed at 04/24/2021 0600 Gross per 24 hour  Intake 1362.27 ml  Output 475 ml  Net 887.27 ml   Filed Weights   04/20/21 2052  Weight: 58.1 kg    Examination:  General exam: Appears comfortable  Respiratory system: decreased breath sounds b/l. No wheezes Cardiovascular system: S1/S2+. No rubs or clicks Gastrointestinal system: Abd is soft, NT, ND & hypoactive bowel sounds  Central nervous system: Awake and alert. Moves all extremities  Psychiatry: judgement and insight appear abnormal. Flat mood and affect     Data Reviewed: I have personally reviewed following labs and imaging studies  CBC: Recent Labs  Lab 04/20/21 2054 04/21/21 0512 04/22/21 0947 04/23/21 0555 04/24/21 0508  WBC 9.0 10.3 12.6* 11.1* 8.1  NEUTROABS 6.6  --   --   --   --   HGB 11.2* 11.0* 11.2* 11.1* 10.6*  HCT 33.8* 32.2* 32.6* 31.9* 31.3*  MCV 100.6* 98.2 97.0 96.7 98.7  PLT 217 196 198 217 194  Basic Metabolic Panel: Recent Labs  Lab 04/20/21 2054 04/22/21 0947 04/23/21 0555 04/24/21 0508  NA 136 137 137 142  K 3.9 3.8 3.6 3.6  CL 101 102 101 108  CO2 23 24 25 27   GLUCOSE 281* 113* 113* 110*  BUN 17 18 22  26*  CREATININE 1.08 0.78 0.95 0.90  CALCIUM 8.5* 9.0 9.0 8.7*   GFR: Estimated Creatinine Clearance: 41.2 mL/min (by C-G formula based on SCr of 0.9 mg/dL). Liver Function Tests: Recent Labs  Lab 04/20/21 2054  AST 16  ALT 8  ALKPHOS 85  BILITOT 0.5  PROT 7.0  ALBUMIN 3.5   No results for input(s): LIPASE, AMYLASE in the last 168 hours. No results for input(s): AMMONIA in the last 168 hours. Coagulation Profile: Recent Labs  Lab 04/21/21 0512  INR 1.0   Cardiac Enzymes: No results for input(s): CKTOTAL, CKMB, CKMBINDEX, TROPONINI in the last 168  hours. BNP (last 3 results) No results for input(s): PROBNP in the last 8760 hours. HbA1C: No results for input(s): HGBA1C in the last 72 hours. CBG: No results for input(s): GLUCAP in the last 168 hours. Lipid Profile: No results for input(s): CHOL, HDL, LDLCALC, TRIG, CHOLHDL, LDLDIRECT in the last 72 hours. Thyroid Function Tests: No results for input(s): TSH, T4TOTAL, FREET4, T3FREE, THYROIDAB in the last 72 hours. Anemia Panel: Recent Labs    04/22/21 0947  VITAMINB12 113*   Sepsis Labs: No results for input(s): PROCALCITON, LATICACIDVEN in the last 168 hours.  Recent Results (from the past 240 hour(s))  Resp Panel by RT-PCR (Flu A&B, Covid) Nasopharyngeal Swab     Status: None   Collection Time: 04/20/21  9:45 PM   Specimen: Nasopharyngeal Swab; Nasopharyngeal(NP) swabs in vial transport medium  Result Value Ref Range Status   SARS Coronavirus 2 by RT PCR NEGATIVE NEGATIVE Final    Comment: (NOTE) SARS-CoV-2 target nucleic acids are NOT DETECTED.  The SARS-CoV-2 RNA is generally detectable in upper respiratory specimens during the acute phase of infection. The lowest concentration of SARS-CoV-2 viral copies this assay can detect is 138 copies/mL. A negative result does not preclude SARS-Cov-2 infection and should not be used as the sole basis for treatment or other patient management decisions. A negative result may occur with  improper specimen collection/handling, submission of specimen other than nasopharyngeal swab, presence of viral mutation(s) within the areas targeted by this assay, and inadequate number of viral copies(<138 copies/mL). A negative result must be combined with clinical observations, patient history, and epidemiological information. The expected result is Negative.  Fact Sheet for Patients:  04/24/21  Fact Sheet for Healthcare Providers:  04/22/21  This test is no t yet  approved or cleared by the [employer] FDA and  has been authorized for detection and/or diagnosis of SARS-CoV-2 by FDA under an Emergency Use Authorization (EUA). This EUA will remain  in effect (meaning this test can be used) for the duration of the COVID-19 declaration under Section 564(b)(1) of the Act, 21 U.S.C.section 360bbb-3(b)(1), unless the authorization is terminated  or revoked sooner.       Influenza A by PCR NEGATIVE NEGATIVE Final   Influenza B by PCR NEGATIVE NEGATIVE Final    Comment: (NOTE) The Xpert Xpress SARS-CoV-2/FLU/RSV plus assay is intended as an aid in the diagnosis of influenza from Nasopharyngeal swab specimens and should not be used as a sole basis for treatment. Nasal washings and aspirates are unacceptable for Xpert Xpress SARS-CoV-2/FLU/RSV testing.  Fact Sheet for Patients: SeriousBroker.it  Fact Sheet for Healthcare Providers: [employer]ious[employer]ker.it  This test is not yet approved or cleared by the [place] FDA and has been authorized for detection and/or diagnosis of SARS-CoV-2 by FDA under an Emergency Use Authorization (EUA). This EUA will remain in effect (meaning this test can be used) for the duration of the COVID-19 declaration under Section 564(b)(1) of the Act, 21 U.S.C. section 360bbb-3(b)(1), unless the authorization is terminated or revoked.  Performed at [facility], [address], [place], [place] [address]          Radiology Studies: No results found.      Scheduled Meds: . enoxaparin (LOVENOX) injection  40 mg Subcutaneous Q24H  . haloperidol lactate  5 mg Intramuscular Once  . vitamin B-12  1,000 mcg Oral Daily   Continuous Infusions: . dextrose 5 % and 0.9% NaCl 50 mL/hr at 04/23/21 1330     LOS: 2 days    Time spent: 28 mins     Charise Killian, MD [facility] Pager [phone]  If 7PM-7AM, please contact  night-coverage  04/24/2021, 7:25 AM

## 2021-04-24 NOTE — TOC Progression Note (Signed)
Transition of Care [facility]) - Progression Note    Patient Details  Name: Alex Mclaughlin MRN: [id number] Date of Birth: Apr 26, 1925  Transition of [facility]) CM/SW Contact  Maud Deed, LCSW Phone Number: 04/24/2021, 2:51 PM  Clinical Narrative:    Pt's son and Legal guardian denied SNF multiple times and want the pt to go home with [facility] CSW attempted to secure [facility] but has no been successful at this time. Pt already has full time private pay caregivers in the home.   TOC please follow up in securing [facility] for pt.    Expected Discharge Plan: Home w Home Health Services Barriers to Discharge: Continued Medical Work up  Expected Discharge Plan and Services Expected Discharge Plan: Home w Home Health Services     Post Acute Care Choice: Home Health                                         Social Determinants of Health (SDOH) Interventions    Readmission Risk Interventions No flowsheet data found.

## 2021-04-24 NOTE — Progress Notes (Signed)
Pt refused Lovenox. Pt screamed "no, leave me alone."

## 2021-04-24 NOTE — Progress Notes (Signed)
Physical Therapy Treatment Patient Details Name: Alex Mclaughlin MRN: [id number] DOB: 01-30-1925 Today's Date: 04/24/2021    History of Present Illness Alex Mclaughlin is a 89yoM who comes to [facility] on 5/18 after a fall while out to eat. Imagine revealing of Right hip intratrochanteric hip fracture, and left hip x-ray with no acute abnormality. PMH: cognitive impairment, COPD, diverticulitis, esophageal stricture, GERD, HLD, psoraiasis, LSS, CVA, HOH. Ortho consult has reviewed CT, feels fracture to be isolated to greater trochanter only, recommending conservative management and either touchdown or partial weight bearing, the patient can decide based on pain.    PT Comments    Author returning for BID session as planned to meet Ramond Dial in room and demonstrate mobility pt has been able to perform, provide education on precautions, safety, and mobiilty s/p DC. Alex Mclaughlin is receptive, recently lost hsi mother in Feb and has gone through similar episodes assisting in her care in the past. Pt is awake and alert, interactive, still HOH with aids in ears, but understands loud communication in proximity to ear. Pt reports bed wet, noted condom cath still attached but twisted blocking flow. Pt assisted OOB to AMB over to recliner, RNx2 assist with linen change. Will continue to follow. Pt is ready for DC to home from PT standpoint. Pt says new Right hallux pain (MTPJ) reports to believe he is having a gout flare. Sons asks if MD can assist. RN aware.      Follow Up Recommendations  SNF;Supervision/Assistance - 24 hour;Supervision for mobility/OOB (family electing to take pt home with 24/7 caregiver services)     Equipment Recommendations  None recommended by PT    Recommendations for Other Services       Precautions / Restrictions Precautions Precautions: Fall Restrictions Weight Bearing Restrictions: Yes RLE Weight Bearing: Partial weight bearing    Mobility  Bed Mobility Overal bed mobility: Needs  Assistance Bed Mobility: Supine to Sit     Supine to sit: Min assist;HOB elevated Sit to supine: Mod assist        Transfers Overall transfer level: Needs assistance Equipment used: Rolling walker (2 wheeled) Transfers: Sit to/from Stand Sit to Stand: Min assist         General transfer comment: needs minA from standard surface height  Ambulation/Gait Ambulation/Gait assistance: Min guard Gait Distance (Feet): 18 Feet Assistive device: Rolling walker (2 wheeled)       General Gait Details: around bed to recliner   Stairs             Wheelchair Mobility    Modified Rankin (Stroke Patients Only)       Balance Overall balance assessment: Needs assistance;History of Falls                                          Cognition Arousal/Alertness: Awake/alert Behavior During Therapy: WFL for tasks assessed/performed Overall Cognitive Status: Within Functional Limits for tasks assessed                                 General Comments: improving much, but still not at baseline      Exercises      General Comments        Pertinent Vitals/Pain Pain Assessment: Faces Faces Pain Scale: Hurts little more Pain Location: after first AMB trial Pain Intervention(s): Limited  activity within patient's tolerance;Monitored during session;Premedicated before session    Home Living                      Prior Function            PT Goals (current goals can now be found in the care plan section) Acute Rehab PT Goals Patient Stated Goal: go home so he can sit on the porch and smoke a cigarrette PT Goal Formulation: With patient/family Progress towards PT goals: Progressing toward goals    Frequency    7X/week      PT Plan Current plan remains appropriate    Co-evaluation              AM-PAC PT "6 Clicks" Mobility   Outcome Measure  Help needed turning from your back to your side while in a flat bed  without using bedrails?: A Lot Help needed moving from lying on your back to sitting on the side of a flat bed without using bedrails?: A Lot Help needed moving to and from a bed to a chair (including a wheelchair)?: A Lot Help needed standing up from a chair using your arms (e.g., wheelchair or bedside chair)?: A Lot Help needed to walk in hospital room?: A Lot Help needed climbing 3-5 steps with a railing? : A Lot 6 Click Score: 12    End of Session Equipment Utilized During Treatment: Gait belt Activity Tolerance: Patient tolerated treatment well Patient left: with call bell/phone within reach;in chair;with chair alarm set;with family/visitor present Nurse Communication: Mobility status PT Visit Diagnosis: Other abnormalities of gait and mobility (R26.89);Difficulty in walking, not elsewhere classified (R26.2);History of falling (Z91.81)     Time: 7408-1448 PT Time Calculation (min) (ACUTE ONLY): 23 min  Charges:  $Gait Training: 8-22 mins $Therapeutic Exercise: 8-22 mins                     2:08 PM, 04/24/21 Rosamaria Lints, PT, DPT Physical Therapist - [facility]  [phone] (ASCOM)    Denina Rieger C 04/24/2021, 2:05 PM

## 2021-04-24 NOTE — Progress Notes (Signed)
*  PRELIMINARY RESULTS* Echocardiogram 2D Echocardiogram has been performed.  Alex Mclaughlin 04/24/2021, 11:49 AM

## 2021-04-24 NOTE — Progress Notes (Signed)
Physical Therapy Treatment Patient Details Name: Alex Mclaughlin MRN: [id number] DOB: 1925/02/22 Today's Date: 04/24/2021    History of Present Illness Alex Mclaughlin is a 89yoM who comes to [facility] on 5/18 after a fall while out to eat. Imagine revealing of Right hip intratrochanteric hip fracture, and left hip x-ray with no acute abnormality. PMH: cognitive impairment, COPD, diverticulitis, esophageal stricture, GERD, HLD, psoraiasis, LSS, CVA, HOH. Ortho consult has reviewed CT, feels fracture to be isolated to greater trochanter only, recommending conservative management and either touchdown or partial weight bearing, the patient can decide based on pain.    PT Comments    Coordinated pain meds with RN prior to session, 2 earlier attempts author stopped by, but pt fast asleep. Pt more interactive today, hearing aids also in place, agreeable to PT session, although he needs to be oriented to locations and situation. Pt able to progress AMB to 82ft today, takes a 3 minutes recovery break, then AMB again, but soon loses motivation on 2nd walk as his hip pain is worsening some. Pt returned to bed, HOB elevated, knees up, feet down, slight trendelenburg to maintain hips back. Author attempted to call son again, left hippo friendly VM.    Follow Up Recommendations  SNF;Supervision/Assistance - 24 hour;Supervision for mobility/OOB     Equipment Recommendations  None recommended by PT    Recommendations for Other Services       Precautions / Restrictions Precautions Precautions: Fall Restrictions RLE Weight Bearing: Partial weight bearing    Mobility  Bed Mobility Overal bed mobility: Needs Assistance Bed Mobility: Sit to Supine;Supine to Sit     Supine to sit: Min assist;HOB elevated Sit to supine: Mod assist        Transfers Overall transfer level: Needs assistance Equipment used: Rolling walker (2 wheeled) Transfers: Sit to/from Stand Sit to Stand: Min guard;From elevated  surface         General transfer comment: needs minA from standard surface height  Ambulation/Gait Ambulation/Gait assistance: Min guard;Min assist Gait Distance (Feet): 80 Feet Assistive device: Rolling walker (2 wheeled)       General Gait Details: more conssitent AMB without frequent stops, but still quite slow 3-point gait pattern; difficult ymaintaining a straight line of progression with RW, often drifting to right, even driving RW into doorways or cabinets, authro gives minA of RW during turns.   Stairs             Wheelchair Mobility    Modified Rankin (Stroke Patients Only)       Balance Overall balance assessment: Needs assistance;History of Falls                                          Cognition Arousal/Alertness: Awake/alert   Overall Cognitive Status: Impaired/Different from baseline                                 General Comments: no recollection of AMB with authro previous day; ptasks author frankly where he currently is, pt oriented to hospital and recent fall with hip injury.      Exercises      General Comments        Pertinent Vitals/Pain Pain Assessment: Faces Faces Pain Scale: Hurts little more Pain Location: after first AMB trial Pain Intervention(s): Limited activity within patient's tolerance;Monitored  during session;Premedicated before session    Home Living                      Prior Function            PT Goals (current goals can now be found in the care plan section) Acute Rehab PT Goals Patient Stated Goal: go home so he can sit on the porch and smoke a cigarrette PT Goal Formulation: With patient Progress towards PT goals: Progressing toward goals    Frequency    7X/week      PT Plan Current plan remains appropriate    Co-evaluation              AM-PAC PT "6 Clicks" Mobility   Outcome Measure  Help needed turning from your back to your side while in a  flat bed without using bedrails?: A Lot Help needed moving from lying on your back to sitting on the side of a flat bed without using bedrails?: A Lot Help needed moving to and from a bed to a chair (including a wheelchair)?: A Lot Help needed standing up from a chair using your arms (e.g., wheelchair or bedside chair)?: A Lot Help needed to walk in hospital room?: A Lot Help needed climbing 3-5 steps with a railing? : A Lot 6 Click Score: 12    End of Session Equipment Utilized During Treatment: Gait belt Activity Tolerance: No increased pain;Patient limited by fatigue Patient left: in bed;with bed alarm set;with call bell/phone within reach Nurse Communication: Mobility status PT Visit Diagnosis: Other abnormalities of gait and mobility (R26.89);Difficulty in walking, not elsewhere classified (R26.2);History of falling (Z91.81)     Time: 1740-8144 PT Time Calculation (min) (ACUTE ONLY): 23 min  Charges:  $Gait Training: 8-22 mins $Therapeutic Exercise: 8-22 mins                    11:14 AM, 04/24/21 Alex Mclaughlin, PT, DPT Physical Therapist - [facility]  [phone] (ASCOM)      Alex Mclaughlin 04/24/2021, 11:11 AM

## 2021-04-24 NOTE — Plan of Care (Signed)

## 2021-04-25 DIAGNOSIS — S72141A Displaced intertrochanteric fracture of right femur, initial encounter for closed fracture: Secondary | ICD-10-CM | POA: Diagnosis not present

## 2021-04-25 DIAGNOSIS — E876 Hypokalemia: Secondary | ICD-10-CM

## 2021-04-25 DIAGNOSIS — D539 Nutritional anemia, unspecified: Secondary | ICD-10-CM | POA: Diagnosis not present

## 2021-04-25 LAB — CBC
HCT: 26.7 % — ABNORMAL LOW (ref 39.0–52.0)
Hemoglobin: 9.3 g/dL — ABNORMAL LOW (ref 13.0–17.0)
MCH: 33.8 pg (ref 26.0–34.0)
MCHC: 34.8 g/dL (ref 30.0–36.0)
MCV: 97.1 fL (ref 80.0–100.0)
Platelets: 176 10*3/uL (ref 150–400)
RBC: 2.75 MIL/uL — ABNORMAL LOW (ref 4.22–5.81)
RDW: 14 % (ref 11.5–15.5)
WBC: 8.4 10*3/uL (ref 4.0–10.5)
nRBC: 0 % (ref 0.0–0.2)

## 2021-04-25 LAB — BASIC METABOLIC PANEL
Anion gap: 8 (ref 5–15)
BUN: 21 mg/dL (ref 8–23)
CO2: 25 mmol/L (ref 22–32)
Calcium: 8.3 mg/dL — ABNORMAL LOW (ref 8.9–10.3)
Chloride: 106 mmol/L (ref 98–111)
Creatinine, Ser: 0.73 mg/dL (ref 0.61–1.24)
GFR, Estimated: >60 mL/min (ref >60)
Glucose, Bld: 118 mg/dL — ABNORMAL HIGH (ref 70–99)
Potassium: 3.2 mmol/L — ABNORMAL LOW (ref 3.5–5.1)
Sodium: 139 mmol/L (ref 135–145)

## 2021-04-25 MED ORDER — POTASSIUM CHLORIDE CRYS ER 20 MEQ PO TBCR
40.0000 meq | EXTENDED_RELEASE_TABLET | Freq: Once | ORAL | Status: AC
  Administered 2021-04-25: 40 meq via ORAL
  Filled 2021-04-25: qty 2

## 2021-04-25 MED ORDER — CYANOCOBALAMIN 1000 MCG PO TABS: 1000.0000 ug | ORAL_TABLET | Freq: Every day | ORAL | 0 refills | Status: AC

## 2021-04-25 NOTE — TOC Progression Note (Signed)
Transition of Care [facility]) - Progression Note    Patient Details  Name: Alex Mclaughlin MRN: [id number] Date of Birth: [birth year]  [facility] CM/SW Contact  Barrie Dunker, RN Phone Number: 04/25/2021, 11:31 AM  Clinical Narrative:    Ronn Melena with [facility] has accepted the patient for PT, OT, Standley Dakins the patient's son and made him aware he said that they have used [facility] in the past, he stated that the patient has all of the DME needed    Expected Discharge Plan: Home w Home Health Services Barriers to Discharge: Continued Medical Work up  Expected Discharge Plan and Services Expected Discharge Plan: Home w Home Health Services     Post Acute Care Choice: Home Health                                         Social Determinants of Health (SDOH) Interventions    Readmission Risk Interventions No flowsheet data found.

## 2021-04-25 NOTE — TOC Progression Note (Signed)
Transition of Care [facility]) - Progression Note    Patient Details  Name: Alex Mclaughlin MRN: [id number] Date of Birth: April 03, 1925  Transition of Care [facility]) CM/SW Contact  Barrie Dunker, RN Phone Number: 04/25/2021, 11:19 AM  Clinical Narrative:    The patient has full time private pay caregiver at home I reached out to [facility], [place], [facility], [facility], [place], [facility], [facility] requesting one of them to accept the patient, awaiting a responce  Expected Discharge Plan: Home w Home Health Services Barriers to Discharge: Continued Medical Work up  Expected Discharge Plan and Services Expected Discharge Plan: Home w Home Health Services     Post Acute Care Choice: Home Health                                         Social Determinants of Health (SDOH) Interventions    Readmission Risk Interventions No flowsheet data found.

## 2021-04-25 NOTE — Discharge Summary (Signed)
Physician Discharge Summary  Alex Mclaughlin XQJ:[id number] DOB: May 24, 1925 DOA: 04/20/2021  PCP: Jerl Mina, MD  Admit date: 04/20/2021 Discharge date: 04/25/2021  Admitted From: home  Disposition:  Home w/ home health   Recommendations for Outpatient Follow-up:  1. Follow up with PCP in 1-2 weeks 2. F/u ortho surg, Dr. Okey Dupre, in 3-4 weeks   Home Health: yes  Equipment/Devices:  Discharge Condition: stable  CODE STATUS: full  Diet recommendation: regular   Brief/Interim Summary: HPI was taken from Dr. Alinda Money: Alex Mclaughlin is a 87 y.o. male with medical history significant of cognitive decline, COPD, diverticulosis, esophageal stricture, GERD, hyperlipidemia, renal calculi, psoriasis, skin cancer, spinal stenosis, CVA, hard of hearing who presents after a fall at home.             Some history obtained with assistance family as patient is very hard of hearing. Patient had a fall when out to dinner, after stepping on the sidewalk.  Family reports he has had more weakness recently and a more unsteady gait. He has had some decline since his wife passed away earlier this year.  He does still live at home.  He does have help at home already with 24 hour care per son with a person during the day and night (they do not appear to formally trained).             He was recently seen in March due to some cognitive decline and performed poorly on initial test per chart review.  He reports some moderate ongoing hip pain.  Denies fevers, chest pain, SOB, Nausea, Vomiting, Abdominal pain, Diarrhea, constipation.  He currently takes only Tylenol.  ED Course: Vital signs in the ED stable, blood pressure in the 100s to 110s systolic.  Lab work-up showed CMP with glucose 281 and calcium 8.5.  CBC showed hemoglobin of 11.2 and MCV of 100.  Initial troponin normal with repeat pending.  Respiratory panel for flu and COVID negative.  Urinalysis normal except for some protein.  Imaging work-up included  chest x-ray showing only mild vascular congestion, CT head with no acute changes and stable chronic changes, CT C-spine with no acute changes, right hip x-ray showing right intratrochanteric hip fracture, and left hip x-ray with no acute abnormality.  Patient received dose of fentanyl and Tylenol in the ED and Ortho was consulted who recommended n.p.o. at midnight.  Hospital course from Dr. Mayford Knife 5/19-5/23/22: Pt was found to have right hip fracture of the greater trochanter w/o extension into the intertrochanteric region. This fracture did not require surgical intervention as per ortho surg. Pt is weightbearing as tolerated based on pain as per ortho surg. PT/OT evaluated the pt and recommended SNF but pt's family declined but agreed w/ home health. Home health was set up by CM prior to d/c and pt has caregivers at home as well as per pt's son. For more information, please see previous progress/consult notes.   Discharge Diagnoses:  Principal Problem:   Closed intertrochanteric fracture of hip, right, initial encounter [facility]) Active Problems:   Hard of hearing   Macrocytic anemia   Hip fracture, right (HCC)   Right hip fracture: of the greater trochanter w/o extension into the intertrochanteric region. Secondary to fall at home. No surgical intervention needed as per ortho. Norco, dilaudid prn for pain. Ortho surg recs apprec. PT/OT recs SNF but pt's family refused SNF but agreed to home health   Macrocytic anemia: secondary to B12 deficiency. Continue on B12 supplement.  Folate is WNL    Hypokalemia: KCl repleated. Will continue to monitor   Likely cognitive impairment: continue w/ supportive care  Possible chronic diastolic CHF: as noted on CXR. S/p IV lasix x1 & pt did not require anymore lasix throughout hospital stay. Echo shows EF 60-65%, grade I diastolic function, no regional wall abnormalities.   Discharge Instructions  Discharge Instructions    Diet general   Complete by: As  directed    Discharge instructions   Complete by: As directed    F/u w/ PCP in 1 week. F/u w/ ortho surg, Dr. Okey Dupre, in 3-4 weeks   Increase activity slowly   Complete by: As directed      Allergies as of 04/25/2021   No Known Allergies     Medication List    TAKE these medications   acetaminophen 325 MG tablet Commonly known as: TYLENOL Take 650 mg by mouth every 4 (four) hours as needed for pain.   cyanocobalamin 1000 MCG tablet Take 1 tablet (1,000 mcg total) by mouth daily. Start taking on: Apr 26, 2021       No Known Allergies  Consultations:  Ortho surg   Procedures/Studies: CT Head Wo Contrast  Result Date: 04/20/2021 CLINICAL DATA:  Fall EXAM: CT HEAD WITHOUT CONTRAST TECHNIQUE: Contiguous axial images were obtained from the base of the skull through the vertex without intravenous contrast. COMPARISON:  None. FINDINGS: Brain: Right temporal and parietal encephalomalacia is unchanged. Multiple old small vessel infarcts. Generalized atrophy. There is periventricular hypoattenuation compatible with chronic microvascular disease. No acute hemorrhage. Vascular: No abnormal hyperdensity of the major intracranial arteries or dural venous sinuses. No intracranial atherosclerosis. Skull: The visualized skull base, calvarium and extracranial soft tissues are normal. Sinuses/Orbits: No fluid levels or advanced mucosal thickening of the visualized paranasal sinuses. No mastoid or middle ear effusion. The orbits are normal. IMPRESSION: 1. No acute intracranial abnormality. 2. Unchanged right temporal and parietal encephalomalacia and multiple old small vessel infarcts. Electronically Signed   By: Deatra Robinson M.D.   On: 04/20/2021 22:26   CT Cervical Spine Wo Contrast  Result Date: 04/20/2021 CLINICAL DATA:  Fall EXAM: CT CERVICAL SPINE WITHOUT CONTRAST TECHNIQUE: Multidetector CT imaging of the cervical spine was performed without intravenous contrast. Multiplanar CT image  reconstructions were also generated. COMPARISON:  None. FINDINGS: Alignment: No static subluxation. Facets are aligned. Occipital condyles and the lateral masses of C1 and C2 are normally approximated. Skull base and vertebrae: No acute fracture. Soft tissues and spinal canal: No prevertebral fluid or swelling. No visible canal hematoma. Disc levels: Multilevel degenerative change without high-grade spinal canal stenosis. Upper chest: No pneumothorax, pulmonary nodule or pleural effusion. Other: Normal visualized paraspinal cervical soft tissues. IMPRESSION: No acute fracture or static subluxation of the cervical spine. Electronically Signed   By: Deatra Robinson M.D.   On: 04/20/2021 22:31   CT Hip Right Wo Contrast  Result Date: 04/21/2021 CLINICAL DATA:  Fall, right hip fracture EXAM: CT OF THE RIGHT HIP WITHOUT CONTRAST TECHNIQUE: Multidetector CT imaging of the right hip was performed according to the standard protocol. Multiplanar CT image reconstructions were also generated. COMPARISON:  Plain radiographs 04/20/2021 FINDINGS: Bones/Joint/Cartilage There is an acute, minimally comminuted fracture of the posterosuperior aspect of the greater trochanter. The fracture plane does not extend into the intratrochanteric region or the femoral neck. The femoral head is still seated within the right acetabulum. The visualized sacrum and the right ilium are intact. There is mild right hip degenerative  arthritis noted. Ligaments Suboptimally assessed by CT. Muscles and Tendons The fracture fragments appear medial and superior to the insertion of the a gluteal tendons upon the greater trochanter. Iliopsoas and hamstring tendons are intact. Mild fatty infiltration of the a gluteal and iliopsoas and adductor musculature. Soft tissues A a small inter fascial hematoma is seen adjacent to the fractured greater trochanter measuring at least 2.3 by 6.0 cm in greatest dimension. Small fat containing inguinal hernia is  incidentally noted. Vascular calcifications are noted within the visualized lower extremity arterial inflow and outflow. IMPRESSION: Acute, mildly comminuted fracture of the greater trochanter of the right hip without extension of the fracture plane into the intratrochanteric region or femoral neck. No dislocation. The fracture fragment appears separate from the insertion of the gluteal tendons. Small inter fascial hematoma adjacent to the fractured greater trochanter. Electronically Signed   By: Helyn Numbers MD   On: 04/21/2021 00:28   DG Chest Portable 1 View  Result Date: 04/20/2021 CLINICAL DATA:  Weakness EXAM: PORTABLE CHEST 1 VIEW COMPARISON:  02/11/2016 FINDINGS: Cardiac shadow is within normal limits. Previously seen nodular density in the left upper lobe is not well appreciated. Calcification is noted in the left base stable in appearance from the prior exam. Increased vascular congestion and interstitial edema is noted. No bony abnormality is noted. IMPRESSION: Mild changes of CHF. Electronically Signed   By: Alcide Clever M.D.   On: 04/20/2021 21:28   ECHOCARDIOGRAM COMPLETE  Result Date: 04/24/2021    ECHOCARDIOGRAM REPORT   Patient Name:   ENOC GETTER Date of Exam: 04/24/2021 Medical Rec #:  [id number]       Height:       68.0 in Accession #:    [id number]      Weight:       128.0 lb Date of Birth:  11/11/1925       BSA:          1.691 m Patient Age:    87 years        BP:           136/72 mmHg Patient Gender: M               HR:           79 bpm. Exam Location:  [facility] Procedure: 2D Echo, 3D Echo and Strain Analysis Indications:     CHF I50.21, I50.31  History:         Patient has no prior history of Echocardiogram examinations.  Sonographer:     Overton Mam RDCS Referring Phys:  [id number] Charise Killian Diagnosing Phys: Chilton Si MD  Sonographer Comments: Global longitudinal strain was attempted. IMPRESSIONS  1. Left ventricular ejection fraction, by estimation, is 60 to 65%.  The left ventricle has normal function. The left ventricle has no regional wall motion abnormalities. There is mild left ventricular hypertrophy. Left ventricular diastolic parameters are consistent with Grade I diastolic dysfunction (impaired relaxation).  2. Right ventricular systolic function is normal. The right ventricular size is normal. Tricuspid regurgitation signal is inadequate for assessing PA pressure.  3. The mitral valve is normal in structure. Trivial mitral valve regurgitation. No evidence of mitral stenosis.  4. The aortic valve is tricuspid. Aortic valve regurgitation is not visualized. No aortic stenosis is present.  5. Aortic dilatation noted. There is mild dilatation of the aortic root, measuring 37 mm. There is mild dilatation of the ascending aorta, measuring 37 mm.  6. The inferior vena  cava is normal in size with greater than 50% respiratory variability, suggesting right atrial pressure of 3 mmHg. FINDINGS  Left Ventricle: Left ventricular ejection fraction, by estimation, is 60 to 65%. The left ventricle has normal function. The left ventricle has no regional wall motion abnormalities. Global longitudinal strain performed but not reported based on interpreter judgement due to suboptimal tracking. The left ventricular internal cavity size was normal in size. There is mild left ventricular hypertrophy. Left ventricular diastolic parameters are consistent with Grade I diastolic dysfunction (impaired relaxation). Indeterminate filling pressures. Right Ventricle: The right ventricular size is normal. No increase in right ventricular wall thickness. Right ventricular systolic function is normal. Tricuspid regurgitation signal is inadequate for assessing PA pressure. Left Atrium: Left atrial size was normal in size. Right Atrium: Right atrial size was normal in size. Pericardium: There is no evidence of pericardial effusion. Mitral Valve: The mitral valve is normal in structure. Trivial mitral  valve regurgitation. No evidence of mitral valve stenosis. Tricuspid Valve: The tricuspid valve is normal in structure. Tricuspid valve regurgitation is trivial. No evidence of tricuspid stenosis. Aortic Valve: The aortic valve is tricuspid. Aortic valve regurgitation is not visualized. No aortic stenosis is present. Aortic valve peak gradient measures 9.6 mmHg. Pulmonic Valve: The pulmonic valve was normal in structure. Pulmonic valve regurgitation is not visualized. No evidence of pulmonic stenosis. Aorta: Aortic dilatation noted. There is mild dilatation of the aortic root, measuring 37 mm. There is mild dilatation of the ascending aorta, measuring 37 mm. Venous: The inferior vena cava is normal in size with greater than 50% respiratory variability, suggesting right atrial pressure of 3 mmHg. IAS/Shunts: No atrial level shunt detected by color flow Doppler.  LEFT VENTRICLE PLAX 2D LVIDd:         2.87 cm  Diastology LVIDs:         2.14 cm  LV e' medial:    3.81 cm/s LV PW:         1.06 cm  LV E/e' medial:  12.7 LV IVS:        0.86 cm  LV e' lateral:   7.62 cm/s LVOT diam:     1.80 cm  LV E/e' lateral: 6.4 LV SV:         39 LV SV Index:   23 LVOT Area:     2.54 cm  RIGHT VENTRICLE RV Basal diam:  2.43 cm RV S prime:     14.80 cm/s TAPSE (M-mode): 1.6 cm LEFT ATRIUM             Index       RIGHT ATRIUM          Index LA diam:        3.10 cm 1.83 cm/m  RA Area:     9.35 cm LA Vol (A2C):   23.1 ml 13.66 ml/m RA Volume:   18.50 ml 10.94 ml/m LA Vol (A4C):   21.2 ml 12.54 ml/m LA Biplane Vol: 22.7 ml 13.43 ml/m  AORTIC VALVE                PULMONIC VALVE AV Area (Vmax): 1.39 cm    PV Vmax:       0.90 m/s AV Vmax:        155.00 cm/s PV Peak grad:  3.2 mmHg AV Peak Grad:   9.6 mmHg LVOT Vmax:      84.90 cm/s LVOT Vmean:     47.600 cm/s LVOT VTI:  0.152 m  AORTA Ao Root diam: 3.70 cm Ao Asc diam:  3.70 cm MITRAL VALVE               TRICUSPID VALVE MV Area (PHT): 3.51 cm    TV Peak grad:   30.0 mmHg MV Decel  Time: 216 msec    TV Vmax:        2.74 m/s MV E velocity: 48.40 cm/s MV A velocity: 93.00 cm/s  SHUNTS MV E/A ratio:  0.52        Systemic VTI:  0.15 m                            Systemic Diam: 1.80 cm Tiffany [place] MD Electronically Chilton Sisigned by Chilton Siiffany [place] MD Signature Date/Time: 04/24/2021/12:15:21 PM    Final    DG HIP UNILAT WITH PELVIS 2-3 VIEWS LEFT  Result Date: 04/20/2021 CLINICAL DATA:  Bilateral hip pain and weakness, initial encounter EXAM: DG HIP (WITH OR WITHOUT PELVIS) 2V LEFT COMPARISON:  None. FINDINGS: There is no evidence of hip fracture or dislocation. There is no evidence of arthropathy or other focal bone abnormality. IMPRESSION: No acute abnormality noted. Electronically Signed   By: Alcide CleverMark  Lukens M.D.   On: 04/20/2021 22:34   DG HIP UNILAT WITH PELVIS 2-3 VIEWS RIGHT  Result Date: 04/20/2021 CLINICAL DATA:  Bilateral hip pain and weakness, no known injury, initial encounter EXAM: DG HIP (WITH OR WITHOUT PELVIS) 3V RIGHT COMPARISON:  None. FINDINGS: Pelvic ring is intact. Degenerative changes of the lumbar spine are seen. Lucency is noted in the greater trochanter consistent with an incomplete intratrochanteric fracture. No other focal abnormality is noted. IMPRESSION: Incomplete intratrochanteric fracture on the right. Electronically Signed   By: Alcide CleverMark  Lukens M.D.   On: 04/20/2021 22:34       Subjective: Pt c/o fatigue   Discharge Exam: Vitals:   04/24/21 2042 04/25/21 0749  BP: 131/74 (!) 123/59  Pulse: 95 91  Resp: 17 18  Temp:  98.5 F (36.9 C)  SpO2: 96% 97%   Vitals:   04/24/21 0818 04/24/21 1600 04/24/21 2042 04/25/21 0749  BP: 124/65 122/64 131/74 (!) 123/59  Pulse: 63 68 95 91  Resp: 18 18 17 18   Temp: 99 F (37.2 C) 98.9 F (37.2 C) 97.8 F (36.6 C) 98.5 F (36.9 C)  TempSrc:    Oral  SpO2: 99% 100% 96% 97%  Weight:      Height:        General: Pt is alert, awake, not in acute distress. Frail appearing  Cardiovascular: S1/S2 +, no rubs,  no gallops Respiratory: CTA bilaterally, no wheezing, no rhonchi Abdominal: Soft, NT, ND, bowel sounds + Extremities: no cyanosis    The results of significant diagnostics from this hospitalization (including imaging, microbiology, ancillary and laboratory) are listed below for reference.     Microbiology: Recent Results (from the past 240 hour(s))  Resp Panel by RT-PCR (Flu A&B, Covid) Nasopharyngeal Swab     Status: None   Collection Time: 04/20/21  9:45 PM   Specimen: Nasopharyngeal Swab; Nasopharyngeal(NP) swabs in vial transport medium  Result Value Ref Range Status   SARS Coronavirus 2 by RT PCR NEGATIVE NEGATIVE Final    Comment: (NOTE) SARS-CoV-2 target nucleic acids are NOT DETECTED.  The SARS-CoV-2 RNA is generally detectable in upper respiratory specimens during the acute phase of infection. The lowest concentration of SARS-CoV-2 viral copies this assay can detect is  138 copies/mL. A negative result does not preclude SARS-Cov-2 infection and should not be used as the sole basis for treatment or other patient management decisions. A negative result may occur with  improper specimen collection/handling, submission of specimen other than nasopharyngeal swab, presence of viral mutation(s) within the areas targeted by this assay, and inadequate number of viral copies(<138 copies/mL). A negative result must be combined with clinical observations, patient history, and epidemiological information. The expected result is Negative.  Fact Sheet for Patients:  BloggerCourse.com  Fact Sheet for Healthcare Providers:  SeriousBroker.it  This test is no t yet approved or cleared by the [place] FDA and  has been authorized for detection and/or diagnosis of SARS-CoV-2 by FDA under an Emergency Use Authorization (EUA). This EUA will remain  in effect (meaning this test can be used) for the duration of the COVID-19 declaration  under Section 564(b)(1) of the Act, 21 U.S.C.section 360bbb-3(b)(1), unless the authorization is terminated  or revoked sooner.       Influenza A by PCR NEGATIVE NEGATIVE Final   Influenza B by PCR NEGATIVE NEGATIVE Final    Comment: (NOTE) The Xpert Xpress SARS-CoV-2/FLU/RSV plus assay is intended as an aid in the diagnosis of influenza from Nasopharyngeal swab specimens and should not be used as a sole basis for treatment. Nasal washings and aspirates are unacceptable for Xpert Xpress SARS-CoV-2/FLU/RSV testing.  Fact Sheet for Patients: BloggerCourse.com  Fact Sheet for Healthcare Providers: SeriousBroker.it  This test is not yet approved or cleared by the [place] FDA and has been authorized for detection and/or diagnosis of SARS-CoV-2 by FDA under an Emergency Use Authorization (EUA). This EUA will remain in effect (meaning this test can be used) for the duration of the COVID-19 declaration under Section 564(b)(1) of the Act, 21 U.S.C. section 360bbb-3(b)(1), unless the authorization is terminated or revoked.  Performed at [facility], [address] [place], [place] [address]      Labs: BNP (last 3 results) No results for input(s): BNP in the last 8760 hours. Basic Metabolic Panel: Recent Labs  Lab 04/20/21 2054 04/22/21 0947 04/23/21 0555 04/24/21 0508 04/25/21 0620  NA 136 137 137 142 139  K 3.9 3.8 3.6 3.6 3.2*  CL 101 102 101 108 106  CO2 GLUCOSE 281* 113* 113* 110* 118*  BUN 26* 21  CREATININE 1.08 0.78 0.95 0.90 0.73  CALCIUM 8.5* 9.0 9.0 8.7* 8.3*   Liver Function Tests: Recent Labs  Lab 04/20/21 2054  AST 16  ALT 8  ALKPHOS 85  BILITOT 0.5  PROT 7.0  ALBUMIN 3.5   No results for input(s): LIPASE, AMYLASE in the last 168 hours. No results for input(s): AMMONIA in the last 168 hours. CBC: Recent Labs  Lab 04/20/21 2054 04/21/21 0512  04/22/21 0947 04/23/21 0555 04/24/21 0508 04/25/21 0620  WBC 9.0 10.3 12.6* 11.1* 8.1 8.4  NEUTROABS 6.6  --   --   --   --   --   HGB 11.2* 11.0* 11.2* 11.1* 10.6* 9.3*  HCT 33.8* 32.2* 32.6* 31.9* 31.3* 26.7*  MCV 100.6* 98.2 97.0 96.7 98.7 97.1  PLT 217 196 198 217 194 176   Cardiac Enzymes: No results for input(s): CKTOTAL, CKMB, CKMBINDEX, TROPONINI in the last 168 hours. BNP: Invalid input(s): POCBNP CBG: No results for input(s): GLUCAP in the last 168 hours. D-Dimer No results for input(s): DDIMER in the last 72 hours. Hgb A1c No results for  input(s): HGBA1C in the last 72 hours. Lipid Profile No results for input(s): CHOL, HDL, LDLCALC, TRIG, CHOLHDL, LDLDIRECT in the last 72 hours. Thyroid function studies No results for input(s): TSH, T4TOTAL, T3FREE, THYROIDAB in the last 72 hours.  Invalid input(s): FREET3 Anemia work up No results for input(s): VITAMINB12, FOLATE, FERRITIN, TIBC, IRON, RETICCTPCT in the last 72 hours. Urinalysis    Component Value Date/Time   COLORURINE YELLOW (A) 04/20/2021 2054   APPEARANCEUR HAZY (A) 04/20/2021 2054   APPEARANCEUR Clear 08/20/2013 1422   LABSPEC 1.025 04/20/2021 2054   LABSPEC 1.030 08/20/2013 1422   PHURINE 5.0 04/20/2021 2054   GLUCOSEU NEGATIVE 04/20/2021 2054   GLUCOSEU Negative 08/20/2013 1422   HGBUR NEGATIVE 04/20/2021 2054   BILIRUBINUR NEGATIVE 04/20/2021 2054   BILIRUBINUR Negative 08/20/2013 1422   KETONESUR NEGATIVE 04/20/2021 2054   PROTEINUR 30 (A) 04/20/2021 2054   NITRITE NEGATIVE 04/20/2021 2054   LEUKOCYTESUR NEGATIVE 04/20/2021 2054   LEUKOCYTESUR Negative 08/20/2013 1422   Sepsis Labs Invalid input(s): PROCALCITONIN,  WBC,  LACTICIDVEN Microbiology Recent Results (from the past 240 hour(s))  Resp Panel by RT-PCR (Flu A&B, Covid) Nasopharyngeal Swab     Status: None   Collection Time: 04/20/21  9:45 PM   Specimen: Nasopharyngeal Swab; Nasopharyngeal(NP) swabs in vial transport medium  Result  Value Ref Range Status   SARS Coronavirus 2 by RT PCR NEGATIVE NEGATIVE Final    Comment: (NOTE) SARS-CoV-2 target nucleic acids are NOT DETECTED.  The SARS-CoV-2 RNA is generally detectable in upper respiratory specimens during the acute phase of infection. The lowest concentration of SARS-CoV-2 viral copies this assay can detect is 138 copies/mL. A negative result does not preclude SARS-Cov-2 infection and should not be used as the sole basis for treatment or other patient management decisions. A negative result may occur with  improper specimen collection/handling, submission of specimen other than nasopharyngeal swab, presence of viral mutation(s) within the areas targeted by this assay, and inadequate number of viral copies(<138 copies/mL). A negative result must be combined with clinical observations, patient history, and epidemiological information. The expected result is Negative.  Fact Sheet for Patients:  BloggerCourse.com  Fact Sheet for Healthcare Providers:  SeriousBroker.it  This test is no t yet approved or cleared by the [place] FDA and  has been authorized for detection and/or diagnosis of SARS-CoV-2 by FDA under an Emergency Use Authorization (EUA). This EUA will remain  in effect (meaning this test can be used) for the duration of the COVID-19 declaration under Section 564(b)(1) of the Act, 21 U.S.C.section 360bbb-3(b)(1), unless the authorization is terminated  or revoked sooner.       Influenza A by PCR NEGATIVE NEGATIVE Final   Influenza B by PCR NEGATIVE NEGATIVE Final    Comment: (NOTE) The Xpert Xpress SARS-CoV-2/FLU/RSV plus assay is intended as an aid in the diagnosis of influenza from Nasopharyngeal swab specimens and should not be used as a sole basis for treatment. Nasal washings and aspirates are unacceptable for Xpert Xpress SARS-CoV-2/FLU/RSV testing.  Fact Sheet for  Patients: BloggerCourse.com  Fact Sheet for Healthcare Providers: SeriousBroker.it  This test is not yet approved or cleared by the [place] FDA and has been authorized for detection and/or diagnosis of SARS-CoV-2 by FDA under an Emergency Use Authorization (EUA). This EUA will remain in effect (meaning this test can be used) for the duration of the COVID-19 declaration under Section 564(b)(1) of the Act, 21 U.S.C. section 360bbb-3(b)(1), unless the authorization is terminated or revoked.  Performed at [facility], [address] [place], [place] [address]      Time coordinating discharge: Over 30 minutes  SIGNED:   Charise Killian, MD  [facility] 04/25/2021, 12:01 PM Pager   If 7PM-7AM, please contact night-coverage

## 2021-04-25 NOTE — Progress Notes (Signed)
Discharge instruction: Reviewed discharge instructions with son/POA, Florentina Addison. POA verbalized understanding.  Pt discharged with personal belongings.  IV intact upon removal. Staff wheeled pt out. Pt transported to home via son's vehicle.

## 2021-04-25 NOTE — Progress Notes (Signed)
Physical Therapy Treatment Patient Details Name: Alex Mclaughlin MRN: [id number] DOB: 05/22/1925 Today's Date: 04/25/2021    History of Present Illness Alex Mclaughlin is a 89yoM who comes to [facility] on 5/18 after a fall while out to eat. Imagine revealing of Right hip intratrochanteric hip fracture, and left hip x-ray with no acute abnormality. PMH: cognitive impairment, COPD, diverticulitis, esophageal stricture, GERD, HLD, psoraiasis, LSS, CVA, HOH. Ortho consult has reviewed CT, feels fracture to be isolated to greater trochanter only, recommending conservative management and either touchdown or partial weight bearing, the patient can decide based on pain.    PT Comments    Pt very HOH and required multi-modal cuing for sequencing during the session.  Pt required min A to come to standing along with cues for hand placement and was able to amb 10 feet with slow, cautious step-to pattern.  Pt reported no adverse symptoms during the session other than mod R hip pain with WB only; pt's SpO2 and HR were both WNL during session on room air.  Pt will benefit from PT services in a SNF setting upon discharge to safely address deficits listed in patient problem list for decreased caregiver assistance and eventual return to PLOF.    Follow Up Recommendations  SNF;Supervision/Assistance - 24 hour;Supervision for mobility/OOB     Equipment Recommendations  None recommended by PT    Recommendations for Other Services       Precautions / Restrictions Precautions Precautions: Fall Restrictions Weight Bearing Restrictions: Yes RLE Weight Bearing: Partial weight bearing RLE Partial Weight Bearing Percentage or Pounds: TDWB or PWB as tolerated    Mobility  Bed Mobility               General bed mobility comments: NT, pt in recliner    Transfers Overall transfer level: Needs assistance Equipment used: Rolling walker (2 wheeled) Transfers: Sit to/from Stand Sit to Stand: Min assist          General transfer comment: Min A to stand from standard surface height; multiple attempts for pt to stand without physical assist with pt unable; mod cues for hand placement  Ambulation/Gait Ambulation/Gait assistance: Min guard Gait Distance (Feet): 12 Feet Assistive device: Rolling walker (2 wheeled) Gait Pattern/deviations: Step-to pattern;Antalgic;Decreased stance time - right Gait velocity: decreased   General Gait Details: Step-to sequencing for WB compliance   Stairs             Wheelchair Mobility    Modified Rankin (Stroke Patients Only)       Balance Overall balance assessment: Needs assistance;History of Falls   Sitting balance-Leahy Scale: Good     Standing balance support: Bilateral upper extremity supported;During functional activity Standing balance-Leahy Scale: Fair                              Cognition Arousal/Alertness: Awake/alert Behavior During Therapy: WFL for tasks assessed/performed Overall Cognitive Status: Within Functional Limits for tasks assessed                                        Exercises Total Joint Exercises Ankle Circles/Pumps: AROM;Strengthening;Both;10 reps Quad Sets: Strengthening;Both;10 reps Hip ABduction/ADduction: AAROM;Left;10 reps Straight Leg Raises: AAROM;Left;10 reps Long Arc Quad: AROM;Strengthening;Both;10 reps Knee Flexion: AROM;Strengthening;Both;10 reps Marching in Standing: AROM;Strengthening;Both;5 reps;Seated    General Comments        Pertinent  Vitals/Pain Pain Assessment: Faces Pain Score: 4  Pain Location: R hip with WB Pain Descriptors / Indicators: Guarding Pain Intervention(s): Premedicated before session;Monitored during session    Home Living                      Prior Function            PT Goals (current goals can now be found in the care plan section) Progress towards PT goals: Progressing toward goals    Frequency     7X/week      PT Plan Current plan remains appropriate    Co-evaluation              AM-PAC PT "6 Clicks" Mobility   Outcome Measure  Help needed turning from your back to your side while in a flat bed without using bedrails?: A Lot Help needed moving from lying on your back to sitting on the side of a flat bed without using bedrails?: A Lot Help needed moving to and from a bed to a chair (including a wheelchair)?: A Lot Help needed standing up from a chair using your arms (e.g., wheelchair or bedside chair)?: A Little Help needed to walk in hospital room?: A Little Help needed climbing 3-5 steps with a railing? : A Lot 6 Click Score: 14    End of Session Equipment Utilized During Treatment: Gait belt Activity Tolerance: Patient tolerated treatment well Patient left: in chair;with call bell/phone within reach;with chair alarm set Nurse Communication: Mobility status PT Visit Diagnosis: Other abnormalities of gait and mobility (R26.89);Difficulty in walking, not elsewhere classified (R26.2);History of falling (Z91.81)     Time: 9373-4287 PT Time Calculation (min) (ACUTE ONLY): 24 min  Charges:  $Gait Training: 8-22 mins $Therapeutic Exercise: 8-22 mins                     D. Scott Wilver Tignor PT, DPT 04/25/21, 9:39 AM

## 2021-04-25 NOTE — Care Management Important Message (Signed)
Important Message  Patient Details  Name: Alex Mclaughlin MRN: [id number] Date of Birth: 1925/10/09   Medicare Important Message Given:  Yes     Johnell Comings 04/25/2021, 12:08 PM

## 2021-04-25 NOTE — Evaluation (Signed)
Clinical/Bedside Swallow Evaluation Patient Details  Name: Alex Mclaughlin MRN: [id number] Date of Birth: 1925-05-24  Today's Date: 04/25/2021 Time: SLP Start Time (ACUTE ONLY): 1030 SLP Stop Time (ACUTE ONLY): 1046 SLP Time Calculation (min) (ACUTE ONLY): 16 min  Past Medical History:  Past Medical History:  Diagnosis Date  . H/O hernia repair 2017  . Stroke [facility]) 1978   Past Surgical History: History reviewed. No pertinent surgical history. HPI:  Alex Mclaughlin is a 87 y.o. male with medical history significant of cognitive decline, COPD, diverticulosis, esophageal stricture, GERD, hyperlipidemia, renal calculi, psoriasis, skin cancer, spinal stenosis, CVA, hard of hearing who presents after a fall at home. He does have help at home already with 24 hour care per son with a person during the day and night (they do not appear to formally trained). This fracture did not require surgical intervention as per ortho surg.   Assessment / Plan / Recommendation Clinical Impression  Pt presents with cognitive based oropharyngeal dysphagia. As such, he is at high risk of aspiration given that he is not able to follow directions, use compensatory strategies, is very HOH to perceive instructions and is a feeder. Pt also speaks with food in his mouth which also prompts coughing. Pt's oral phase is c/b intermittently inattention to boluses, reduced mastication of solids and his oral phase can be very lengthy. It fluctuates with his attention to task. Pt's pharyngeal phase is c/b immediate coughing when consuming consecutive sips of thin liquids. Suspect delayed swallow initiation. However when consuming single sips or via spoon, pt doesn't display any coughing. Pt also consumed nectar thick liquids without any overt s/s of aspiration. While silent aspiration cannot be ruled out at bedside, would recommend dysphagia 1 diet with thin liquids via straw WITH SUPPORT FOR 1 SIP AT A TIME, medicine crushed in puree.  Pt requires STRICT ASPIRATION precautions to reduce risk of aspiration pneumonia. SLP Visit Diagnosis: Dysphagia, oropharyngeal phase (R13.12);Cognitive communication deficit (R41.841)    Aspiration Risk  Moderate aspiration risk;Severe aspiration risk    Diet Recommendation Dysphagia 1 (Puree);Thin liquid   Liquid Administration via: Straw;Spoon Medication Administration: Crushed with puree Supervision: Full supervision/cueing for compensatory strategies;Staff to assist with self feeding Compensations: Minimize environmental distractions;Slow rate;Small sips/bites;Follow solids with liquid Postural Changes: Seated upright at 90 degrees    Other  Recommendations Oral Care Recommendations: Oral care BID   Follow up Recommendations Home health SLP      Frequency and Duration            Prognosis Prognosis for Safe Diet Advancement: Fair Barriers to Reach Goals: Cognitive deficits;Severity of deficits;Time post onset      Swallow Study   General Date of Onset: 04/22/21 HPI: TIONNE Mclaughlin is a 87 y.o. male with medical history significant of cognitive decline, COPD, diverticulosis, esophageal stricture, GERD, hyperlipidemia, renal calculi, psoriasis, skin cancer, spinal stenosis, CVA, hard of hearing who presents after a fall at home. He does have help at home already with 24 hour care per son with a person during the day and night (they do not appear to formally trained). This fracture did not require surgical intervention as per ortho surg. Type of Study: Bedside Swallow Evaluation Previous Swallow Assessment: none in chart Diet Prior to this Study: Regular;Thin liquids Temperature Spikes Noted: No Respiratory Status: Room air History of Recent Intubation: No Behavior/Cognition: Alert;Pleasant mood;Confused;Doesn't follow directions;Distractible;Uncooperative Oral Cavity Assessment: Within Functional Limits Oral Care Completed by SLP: No Oral Cavity - Dentition: Dentures,  bottom;Dentures, top Self-Feeding Abilities: Needs set up;Total assist Patient Positioning: Upright in chair Baseline Vocal Quality: Low vocal intensity Volitional Cough: Strong Volitional Swallow: Unable to elicit    Oral/Motor/Sensory Function Overall Oral Motor/Sensory Function: Generalized oral weakness   Ice Chips     Thin Liquid Thin Liquid: Impaired Presentation: Straw;Spoon Oral Phase Impairments: Poor awareness of bolus Pharyngeal  Phase Impairments: Suspected delayed Swallow;Cough - Immediate (when consuming consecutive sips)    Nectar Thick Nectar Thick Liquid: Within functional limits Presentation: Spoon;Cup   Honey Thick Honey Thick Liquid: Not tested   Puree Puree: Within functional limits Presentation: Spoon   Solid     Solid: Impaired Oral Phase Impairments: Poor awareness of bolus;Impaired mastication Oral Phase Functional Implications: Prolonged oral transit;Impaired mastication;Oral residue;Oral holding     Haadi Santellan B. Dreama Saa M.S., CCC-SLP, [facility] Speech-Language Pathologist Rehabilitation Services Office [phone]  Tawonna Esquer Dreama Saa 04/25/2021,12:47 PM

## 2022-01-27 ENCOUNTER — Emergency Department: Payer: Medicare Other

## 2022-01-27 ENCOUNTER — Inpatient Hospital Stay
Admission: EM | Admit: 2022-01-27 | Discharge: 2022-02-01 | DRG: 951 | Disposition: E | Payer: Medicare Other | Attending: Internal Medicine | Admitting: Internal Medicine

## 2022-01-27 ENCOUNTER — Other Ambulatory Visit: Payer: Self-pay

## 2022-01-27 DIAGNOSIS — J9601 Acute respiratory failure with hypoxia: Secondary | ICD-10-CM | POA: Diagnosis present

## 2022-01-27 DIAGNOSIS — E872 Acidosis, unspecified: Secondary | ICD-10-CM | POA: Diagnosis present

## 2022-01-27 DIAGNOSIS — R6520 Severe sepsis without septic shock: Secondary | ICD-10-CM

## 2022-01-27 DIAGNOSIS — I714 Abdominal aortic aneurysm, without rupture, unspecified: Secondary | ICD-10-CM | POA: Diagnosis present

## 2022-01-27 DIAGNOSIS — A419 Sepsis, unspecified organism: Secondary | ICD-10-CM

## 2022-01-27 DIAGNOSIS — A411 Sepsis due to other specified staphylococcus: Secondary | ICD-10-CM | POA: Diagnosis present

## 2022-01-27 DIAGNOSIS — Z515 Encounter for palliative care: Secondary | ICD-10-CM | POA: Diagnosis not present

## 2022-01-27 DIAGNOSIS — F17210 Nicotine dependence, cigarettes, uncomplicated: Secondary | ICD-10-CM | POA: Diagnosis present

## 2022-01-27 DIAGNOSIS — R6521 Severe sepsis with septic shock: Secondary | ICD-10-CM | POA: Diagnosis present

## 2022-01-27 DIAGNOSIS — Z720 Tobacco use: Secondary | ICD-10-CM | POA: Diagnosis present

## 2022-01-27 DIAGNOSIS — Z8673 Personal history of transient ischemic attack (TIA), and cerebral infarction without residual deficits: Secondary | ICD-10-CM

## 2022-01-27 DIAGNOSIS — Z800 Family history of malignant neoplasm of digestive organs: Secondary | ICD-10-CM

## 2022-01-27 DIAGNOSIS — I81 Portal vein thrombosis: Secondary | ICD-10-CM | POA: Diagnosis present

## 2022-01-27 DIAGNOSIS — I8290 Acute embolism and thrombosis of unspecified vein: Secondary | ICD-10-CM

## 2022-01-27 DIAGNOSIS — I5032 Chronic diastolic (congestive) heart failure: Secondary | ICD-10-CM | POA: Diagnosis not present

## 2022-01-27 DIAGNOSIS — F0390 Unspecified dementia without behavioral disturbance: Secondary | ICD-10-CM | POA: Diagnosis present

## 2022-01-27 DIAGNOSIS — Z20822 Contact with and (suspected) exposure to covid-19: Secondary | ICD-10-CM | POA: Diagnosis present

## 2022-01-27 DIAGNOSIS — Z66 Do not resuscitate: Secondary | ICD-10-CM | POA: Diagnosis present

## 2022-01-27 DIAGNOSIS — R4020 Unspecified coma: Secondary | ICD-10-CM | POA: Diagnosis present

## 2022-01-27 DIAGNOSIS — E876 Hypokalemia: Secondary | ICD-10-CM | POA: Diagnosis present

## 2022-01-27 DIAGNOSIS — D539 Nutritional anemia, unspecified: Secondary | ICD-10-CM | POA: Diagnosis present

## 2022-01-27 DIAGNOSIS — J189 Pneumonia, unspecified organism: Secondary | ICD-10-CM | POA: Diagnosis present

## 2022-01-27 DIAGNOSIS — H9190 Unspecified hearing loss, unspecified ear: Secondary | ICD-10-CM | POA: Diagnosis present

## 2022-01-27 DIAGNOSIS — R112 Nausea with vomiting, unspecified: Secondary | ICD-10-CM

## 2022-01-27 DIAGNOSIS — N179 Acute kidney failure, unspecified: Secondary | ICD-10-CM | POA: Diagnosis present

## 2022-01-27 DIAGNOSIS — I639 Cerebral infarction, unspecified: Secondary | ICD-10-CM | POA: Diagnosis present

## 2022-01-27 DIAGNOSIS — R197 Diarrhea, unspecified: Secondary | ICD-10-CM | POA: Diagnosis present

## 2022-01-27 LAB — COMPREHENSIVE METABOLIC PANEL
ALT: 11 U/L (ref 0–44)
AST: 25 U/L (ref 15–41)
Albumin: 3 g/dL — ABNORMAL LOW (ref 3.5–5.0)
Alkaline Phosphatase: 88 U/L (ref 38–126)
Anion gap: 12 (ref 5–15)
BUN: 20 mg/dL (ref 8–23)
CO2: 24 mmol/L (ref 22–32)
Calcium: 8.3 mg/dL — ABNORMAL LOW (ref 8.9–10.3)
Chloride: 103 mmol/L (ref 98–111)
Creatinine, Ser: 1.21 mg/dL (ref 0.61–1.24)
GFR, Estimated: 55 mL/min — ABNORMAL LOW (ref >60)
Glucose, Bld: 179 mg/dL — ABNORMAL HIGH (ref 70–99)
Potassium: 3.4 mmol/L — ABNORMAL LOW (ref 3.5–5.1)
Sodium: 139 mmol/L (ref 135–145)
Total Bilirubin: 0.6 mg/dL (ref 0.3–1.2)
Total Protein: 6.3 g/dL — ABNORMAL LOW (ref 6.5–8.1)

## 2022-01-27 LAB — CBC WITH DIFFERENTIAL/PLATELET
Abs Immature Granulocytes: 0.02 10*3/uL (ref 0.00–0.07)
Basophils Absolute: 0 10*3/uL (ref 0.0–0.1)
Basophils Relative: 0 %
Eosinophils Absolute: 0 10*3/uL (ref 0.0–0.5)
Eosinophils Relative: 0 %
HCT: 36.7 % — ABNORMAL LOW (ref 39.0–52.0)
Hemoglobin: 12 g/dL — ABNORMAL LOW (ref 13.0–17.0)
Immature Granulocytes: 0 %
Lymphocytes Relative: 5 %
Lymphs Abs: 0.4 10*3/uL — ABNORMAL LOW (ref 0.7–4.0)
MCH: 33.1 pg (ref 26.0–34.0)
MCHC: 32.7 g/dL (ref 30.0–36.0)
MCV: 101.1 fL — ABNORMAL HIGH (ref 80.0–100.0)
Monocytes Absolute: 0.3 10*3/uL (ref 0.1–1.0)
Monocytes Relative: 4 %
Neutro Abs: 7.1 10*3/uL (ref 1.7–7.7)
Neutrophils Relative %: 91 %
Platelets: 216 10*3/uL (ref 150–400)
RBC: 3.63 MIL/uL — ABNORMAL LOW (ref 4.22–5.81)
RDW: 13.7 % (ref 11.5–15.5)
WBC: 7.8 10*3/uL (ref 4.0–10.5)
nRBC: 0 % (ref 0.0–0.2)

## 2022-01-27 LAB — TROPONIN I (HIGH SENSITIVITY)
Troponin I (High Sensitivity): 9 ng/L (ref <18)
Troponin I (High Sensitivity): 10 ng/L (ref <18)

## 2022-01-27 LAB — RESP PANEL BY RT-PCR (FLU A&B, COVID) ARPGX2
Influenza A by PCR: NEGATIVE
Influenza B by PCR: NEGATIVE
SARS Coronavirus 2 by RT PCR: NEGATIVE

## 2022-01-27 LAB — URINALYSIS, COMPLETE (UACMP) WITH MICROSCOPIC
Bilirubin Urine: NEGATIVE
Glucose, UA: NEGATIVE mg/dL
Ketones, ur: NEGATIVE mg/dL
Leukocytes,Ua: NEGATIVE
Nitrite: NEGATIVE
Protein, ur: 100 mg/dL — AB
Specific Gravity, Urine: 1.028 (ref 1.005–1.030)
pH: 5 (ref 5.0–8.0)

## 2022-01-27 LAB — D-DIMER, QUANTITATIVE: D-Dimer, Quant: 8.9 ug/mL-FEU — ABNORMAL HIGH (ref 0.00–0.50)

## 2022-01-27 LAB — BRAIN NATRIURETIC PEPTIDE: B Natriuretic Peptide: 80 pg/mL (ref 0.0–100.0)

## 2022-01-27 LAB — LACTIC ACID, PLASMA
Lactic Acid, Venous: 4.3 mmol/L (ref 0.5–1.9)
Lactic Acid, Venous: 3.8 mmol/L (ref 0.5–1.9)

## 2022-01-27 LAB — PROTIME-INR
INR: 1 (ref 0.8–1.2)
Prothrombin Time: 12.9 seconds (ref 11.4–15.2)

## 2022-01-27 LAB — APTT: aPTT: 27 seconds (ref 24–36)

## 2022-01-27 LAB — LIPASE, BLOOD: Lipase: 23 U/L (ref 11–51)

## 2022-01-27 MED ORDER — MIDODRINE HCL 5 MG PO TABS: 10.0000 mg | ORAL_TABLET | Freq: Three times a day (TID) | ORAL | Status: DC

## 2022-01-27 MED ORDER — MORPHINE SULFATE (PF) 2 MG/ML IV SOLN
2.0000 mg | INTRAVENOUS | Status: DC | PRN
  Administered 2022-01-27: 23:00:00 2 mg via INTRAVENOUS
  Filled 2022-01-27: qty 1

## 2022-01-27 MED ORDER — MORPHINE 100MG IN NS 100ML (1MG/ML) PREMIX INFUSION
3.0000 mg/h | INTRAVENOUS | Status: DC
  Administered 2022-01-27: 19:00:00 1 mg/h via INTRAVENOUS
  Filled 2022-01-27: qty 100

## 2022-01-27 MED ORDER — METRONIDAZOLE 500 MG/100ML IV SOLN
500.0000 mg | Freq: Once | INTRAVENOUS | Status: AC
  Administered 2022-01-27: 500 mg via INTRAVENOUS
  Filled 2022-01-27: qty 100

## 2022-01-27 MED ORDER — NICOTINE 21 MG/24HR TD PT24: 21.0000 mg | MEDICATED_PATCH | Freq: Every day | TRANSDERMAL | Status: DC

## 2022-01-27 MED ORDER — LACTATED RINGERS IV BOLUS (SEPSIS)
1000.0000 mL | Freq: Once | INTRAVENOUS | Status: AC
  Administered 2022-01-27: 1000 mL via INTRAVENOUS

## 2022-01-27 MED ORDER — LACTATED RINGERS IV SOLN: INTRAVENOUS | Status: DC

## 2022-01-27 MED ORDER — GLYCOPYRROLATE 0.2 MG/ML IJ SOLN
0.2000 mg | INTRAMUSCULAR | Status: DC | PRN
  Filled 2022-01-27: qty 1

## 2022-01-27 MED ORDER — ACETAMINOPHEN 325 MG PO TABS: 650.0000 mg | ORAL_TABLET | Freq: Four times a day (QID) | ORAL | Status: DC | PRN

## 2022-01-27 MED ORDER — ALBUTEROL SULFATE (2.5 MG/3ML) 0.083% IN NEBU
2.5000 mg | INHALATION_SOLUTION | RESPIRATORY_TRACT | Status: DC | PRN
Start: 2022-01-27 — End: 2022-01-27

## 2022-01-27 MED ORDER — ALBUMIN HUMAN 25 % IV SOLN
25.0000 g | Freq: Once | INTRAVENOUS | Status: DC
  Filled 2022-01-27: qty 100

## 2022-01-27 MED ORDER — HALOPERIDOL LACTATE 5 MG/ML IJ SOLN
1.0000 mg | INTRAMUSCULAR | Status: DC | PRN
  Administered 2022-01-27 (×2): 1 mg via INTRAVENOUS
  Filled 2022-01-27 (×2): qty 1

## 2022-01-27 MED ORDER — CHLORHEXIDINE GLUCONATE 0.12 % MT SOLN
15.0000 mL | Freq: Two times a day (BID) | OROMUCOSAL | Status: DC
  Administered 2022-01-27 – 2022-01-28 (×2): 15 mL via OROMUCOSAL

## 2022-01-27 MED ORDER — LORAZEPAM 2 MG/ML IJ SOLN
0.5000 mg | Freq: Once | INTRAMUSCULAR | Status: AC
  Administered 2022-01-27: 0.5 mg via INTRAVENOUS
  Filled 2022-01-27: qty 1

## 2022-01-27 MED ORDER — IOHEXOL 350 MG/ML SOLN
80.0000 mL | Freq: Once | INTRAVENOUS | Status: AC | PRN
  Administered 2022-01-27: 80 mL via INTRAVENOUS

## 2022-01-27 MED ORDER — SODIUM CHLORIDE 0.9 % IV SOLN
2.0000 g | Freq: Once | INTRAVENOUS | Status: AC
  Administered 2022-01-27: 2 g via INTRAVENOUS
  Filled 2022-01-27: qty 2

## 2022-01-27 MED ORDER — POLYVINYL ALCOHOL 1.4 % OP SOLN
1.0000 [drp] | Freq: Four times a day (QID) | OPHTHALMIC | Status: DC | PRN
  Filled 2022-01-27: qty 15

## 2022-01-27 MED ORDER — BIOTENE DRY MOUTH MT LIQD
15.0000 mL | OROMUCOSAL | Status: DC | PRN
  Filled 2022-01-27: qty 15

## 2022-01-27 MED ORDER — HEPARIN (PORCINE) 25000 UT/250ML-% IV SOLN
850.0000 [IU]/h | INTRAVENOUS | Status: DC
  Administered 2022-01-27: 850 [IU]/h via INTRAVENOUS
  Filled 2022-01-27: qty 250

## 2022-01-27 MED ORDER — ONDANSETRON HCL 4 MG/2ML IJ SOLN
4.0000 mg | Freq: Three times a day (TID) | INTRAMUSCULAR | Status: DC | PRN
  Administered 2022-01-27: 4 mg via INTRAVENOUS
  Filled 2022-01-27: qty 2

## 2022-01-27 MED ORDER — SCOPOLAMINE 1 MG/3DAYS TD PT72
1.0000 | MEDICATED_PATCH | TRANSDERMAL | Status: DC
  Administered 2022-01-27: 1.5 mg via TRANSDERMAL
  Filled 2022-01-27: qty 1

## 2022-01-27 MED ORDER — DM-GUAIFENESIN ER 30-600 MG PO TB12
1.0000 | ORAL_TABLET | Freq: Two times a day (BID) | ORAL | Status: DC | PRN
Start: 2022-01-27 — End: 2022-01-27

## 2022-01-27 MED ORDER — LACTATED RINGERS IV BOLUS: 500.0000 mL | Freq: Once | INTRAVENOUS | Status: DC

## 2022-01-27 MED ORDER — POTASSIUM CHLORIDE CRYS ER 20 MEQ PO TBCR: 40.0000 meq | EXTENDED_RELEASE_TABLET | Freq: Once | ORAL | Status: DC

## 2022-01-27 MED ORDER — ACETAMINOPHEN 500 MG PO TABS
1000.0000 mg | ORAL_TABLET | Freq: Once | ORAL | Status: AC
  Administered 2022-01-27: 1000 mg via ORAL
  Filled 2022-01-27: qty 2

## 2022-01-27 MED ORDER — HYDROCORTISONE SOD SUC (PF) 100 MG IJ SOLR
100.0000 mg | Freq: Once | INTRAMUSCULAR | Status: DC
  Filled 2022-01-27: qty 2

## 2022-01-27 MED ORDER — NAPHAZOLINE-GLYCERIN 0.012-0.25 % OP SOLN
1.0000 [drp] | Freq: Four times a day (QID) | OPHTHALMIC | Status: DC | PRN
  Filled 2022-01-27: qty 15

## 2022-01-27 MED ORDER — ACETAMINOPHEN 650 MG RE SUPP: 650.0000 mg | Freq: Four times a day (QID) | RECTAL | Status: DC | PRN

## 2022-01-27 MED ORDER — LACTATED RINGERS IV BOLUS
1000.0000 mL | Freq: Once | INTRAVENOUS | Status: AC
  Administered 2022-01-27: 1000 mL via INTRAVENOUS

## 2022-01-27 MED ORDER — METRONIDAZOLE 500 MG/100ML IV SOLN: 500.0000 mg | Freq: Three times a day (TID) | INTRAVENOUS | Status: DC

## 2022-01-27 MED ORDER — LORAZEPAM 2 MG/ML IJ SOLN
1.0000 mg | INTRAMUSCULAR | Status: DC | PRN
  Administered 2022-01-27 (×3): 1 mg via INTRAVENOUS
  Filled 2022-01-27 (×3): qty 1

## 2022-01-27 MED ORDER — MORPHINE SULFATE (PF) 2 MG/ML IV SOLN
2.0000 mg | INTRAVENOUS | Status: DC | PRN
  Administered 2022-01-27: 2 mg via INTRAVENOUS
  Filled 2022-01-27: qty 1

## 2022-01-27 MED ORDER — GLYCOPYRROLATE 1 MG PO TABS
1.0000 mg | ORAL_TABLET | ORAL | Status: DC | PRN
  Filled 2022-01-27: qty 1

## 2022-01-27 MED ORDER — ONDANSETRON HCL 4 MG/2ML IJ SOLN: 4.0000 mg | Freq: Four times a day (QID) | INTRAMUSCULAR | Status: DC | PRN

## 2022-01-27 MED ORDER — ORAL CARE MOUTH RINSE
15.0000 mL | Freq: Two times a day (BID) | OROMUCOSAL | Status: DC
  Administered 2022-01-28 (×2): 15 mL via OROMUCOSAL

## 2022-01-27 MED ORDER — VANCOMYCIN HCL IN DEXTROSE 1-5 GM/200ML-% IV SOLN
1000.0000 mg | Freq: Once | INTRAVENOUS | Status: AC
  Administered 2022-01-27: 1000 mg via INTRAVENOUS
  Filled 2022-01-27: qty 200

## 2022-01-27 MED ORDER — HEPARIN BOLUS VIA INFUSION
3300.0000 [IU] | Freq: Once | INTRAVENOUS | Status: AC
  Administered 2022-01-27: 3300 [IU] via INTRAVENOUS
  Filled 2022-01-27: qty 3300

## 2022-01-27 MED ORDER — ONDANSETRON 4 MG PO TBDP: 4.0000 mg | ORAL_TABLET | Freq: Four times a day (QID) | ORAL | Status: DC | PRN

## 2022-01-27 NOTE — ED Triage Notes (Signed)
EMS reports they were called out for N/V/D and generalized weakness.

## 2022-01-27 NOTE — Consult Note (Signed)
Consultation Note Date: 01/26/2022   Patient Name: Alex Mclaughlin  DOB: [birth year]  MRN: [id number]  Age / Sex: 88 y.o., male  PCP: Maryland Pink, MD Referring Physician: Ivor Costa, MD  Reason for Consultation: Establishing goals of care  HPI/Patient Profile: 88 y.o. male  with past medical history of stroke, hernia repair, macrocytic anemia, tobacco use, and cognitive decline admitted on 01/07/2022 with AMS, cough, SOB, and N/V/D.   Clinical Assessment and Goals of Care: I have reviewed medical records including EPIC notes, labs and imaging, assessed the patient and then met with patient  to discuss diagnosis prognosis, GOC, EOL wishes, disposition and options. Pt is full comfort care. He is unable to acknolwedge my presence but opened one eye during my assessment.  I attempted to speak with patient's son Alex Mclaughlin via telephone. No answer and HIPPA appropriate VM left.   I counseled with RN Elana regarding EOL comfort care orders. SHe endorses patient is becoming increasingly agitated with cardiac monitor so wires/leads were removed. Ativan PRN is useful but seems to wear off quickly. Pt has been given one dose of morphine since 7am and RN believes he could benefit from more consistent pain and WOB management. RN shared she has left [place] in place at 4L as not to suffocate the pat. Discussed that this will eventually need to be weened to room air since it is a life sustaining medication.   Reviewed changes in [facility] with RN. Conveyed that pt will need a morphine gtt with ability to immediately bolus from the bag as we transition pt off of oxygen. I do not want to star these measures until I have spoken with son. Currently, pt 's meds reflect ability for nurse to give Morphine PRN with no time frame.  Awaiting call back from son and will continue to monitor the patient.   Primary Decision Maker NEXT OF KIN  Code  Status/Advance Care Planning: DNR Comfort care measures only - do not escalate care and only perform care focused on keeping patient warm, clean, dry, as well as out of pain, agitation, anxiety, breathlessness Titrate oxygen down and pt to room air PRN Morphine, Ativan, Haldol, Zofran Unrestricted visitor access  Prognosis:   Hours - Days  Discharge Planning: Anticipated Hospital Death  Primary Diagnoses: Present on Admission:  Multifocal pneumonia  Nausea vomiting and diarrhea  Macrocytic anemia  Severe sepsis with septic shock (CODE) (HCC)  Hypokalemia  Portal vein thrombosis  Acute respiratory failure with hypoxia (HCC)  Tobacco use  Chronic diastolic CHF (congestive heart failure) ([place])  Stroke ([place])  Coma ([place])  AKI (acute kidney injury) ([place])  AAA (abdominal aortic aneurysm)   Physical Exam Vitals and nursing note reviewed.  Constitutional:      Appearance: He is ill-appearing.  HENT:     Head: Normocephalic.     Nose:     Comments: Left nare appears caved in    Mouth/Throat:     Mouth: Mucous membranes are moist.  Cardiovascular:     Rate and  Rhythm: Normal rate.     Pulses: Normal pulses.  Pulmonary:     Effort: Pulmonary effort is normal.     Comments: 4L [place] in mouth Abdominal:     Palpations: Abdomen is soft.  Musculoskeletal:     Comments: Generalized weakness   Skin:    Comments: Cool UE and LE    Palliative Assessment/Data: 10%     I discussed this patient's plan of care with nursing.  Thank you for this consult. Palliative medicine will continue to follow and assist holistically.   Time Total: 45 minutes Greater than 50%  of this time was spent counseling and coordinating care related to the above assessment and plan.  Signed by: Jordan Hawks, DNP, FNP-BC Palliative Medicine    Please contact Palliative Medicine Team phone at [phone] for questions and concerns.  For individual provider: See Shea Evans

## 2022-01-27 NOTE — Progress Notes (Signed)
Upon entry into room pt with leg between side rail. Leg with scratch and bleeding. Cleansed placed protective dressing.

## 2022-01-27 NOTE — ED Notes (Signed)
Per family no vasopressors requested for pt care

## 2022-01-27 NOTE — H&P (Signed)
History and Physical    [place] [id number] DOB: 12/16/1924 DOA: 01/31/2022  Referring MD/NP/PA:   PCP: Maryland Pink, MD   Patient coming from:  The patient is coming from home.    Chief Complaint: AMS, cough, shortness breath, nausea, vomiting, diarrhea  HPI: Alex [place] is a 88 y.o. male with medical history significant of stroke, hernia repair, macrocytic anemia, cognitive decline, tobacco abuse, who presents with altered mental status, cough, shortness breath, nausea, vomiting, diarrhea.  Per his son at the bedside, at normal baseline, patient recognizes his son, is oriented to place, most of the time pt is oriented to time, but occasionally confused about time.  He was noted to be confused since last night.  He has cough with clear mucus production and shortness of breath.  Does not seem to have chest pain.  Patient was found to have oxygen desaturating to 80% on room air in ED. Patient also has nausea, multiple episodes of nonbilious nonbloody vomiting and diarrhea.  Not sure if patient has abdominal pain.  Patient has fever 102.5 in ED.   Patient was hypotensive with blood pressure 83/58, which transiently improved to 119/62 after giving 3 L LR bolus, then blood pressure decreased to SBP of 70s.  Patient is unresponsive, basically in comatose status when I saw pt in ED. He slightly moves his extremities on painful stimuli.  Patient was also found to have portal vein thrombosis.  IV heparin was started in the ED.  Data Reviewed and ED Course: pt was found to have WBC 7.8, lactic acid 4.3 --> 3.8, INR 1.0, PTT 27, troponin level 9, BNP 80, lipase 23, urinalysis not impressive, negative COVID PCR, D-dimer 8.90, potassium 3.4, AKI with Cre 1.21 and BUN 20 (baseline creatinine 0.73 on 04/25/2021), normal liver function, temperature 102.5, heart rate 121, RR 29.  VBG with pH 7.35, CO2 47 and O2  <31. CT of head is negative for acute intracranial abnormalities.  CT angiogram of  chest is negative for PE, but showed multifocal pneumonia.  CT of abdomen/pelvis showed AAA and portal venous thrombosis.  Patient is admitted to [place] bed for comfort care.  Chest CTA:  1. Generalized airspace disease consistent with multifocal pneumonia. 2. Negative for pulmonary embolism. 3. Aortic Atherosclerosis (ICD10-I70.0) and Emphysema (ICD10-J43.9).   CT-abd/pelvis 1. Intrahepatic portal venous thrombosis in the inferior right lobe. No definite underlying cause. 2. 5.3 cm fusiform abdominal aortic aneurysm. Recommend referral to a vascular specialist if appropriate for comorbidities. This recommendation follows ACR consensus guidelines: White Paper of the ACR Incidental Findings Committee II on Vascular Findings. J Am Coll Radiol 2013; 10:789-794. 3. Cholelithiasis and nephrolithiasis   EKG: I have personally reviewed.  Poor quality of EKG strip, sinus rhythm, LAD, poor R wave progression, anteroseptal infarction pattern   Allergy: No Known Allergies  Past Medical History:  Diagnosis Date   H/O hernia repair 2017   Stroke [facility]) 1978    Past Surgical History:  Procedure Laterality Date   HERNIA REPAIR      Social History:  reports that he has been smoking cigarettes. He has never used smokeless tobacco. He reports that he does not use drugs. No history on file for alcohol use.  Family History:  Family History  Problem Relation Age of Onset   Stomach cancer Mother    Stomach cancer Father      Prior to Admission medications   Medication Sig Start Date End Date Taking? Authorizing Provider  acetaminophen (TYLENOL) 325  MG tablet Take 650 mg by mouth every 4 (four) hours as needed for pain.    [provider]    Physical Exam: Vitals:   01/06/2022 0945 01/11/2022 1015 01/13/2022 1030 01/04/2022 1045  BP: (!) 79/55 (!) 83/57 (!) 99/59 (!) 92/52  Pulse: 96 99 98 91  Resp: 19 (!) 24 (!) 25 18  Temp:      TempSrc:      SpO2: 96% 92% 94% 93%  Weight:       Height:       General: Not in acute distress HEENT:       Eyes: PERRL, EOMI, no scleral icterus.       ENT: No discharge from the ears and nose .       Neck: No JVD, no bruit, no mass felt. Heme: No neck lymph node enlargement. Cardiac: S1/S2, RRR, No murmurs, No gallops or rubs. Respiratory: has crackles bilaterally. GI: Soft, nondistended, nontender, no organomegaly, BS present. GU: No hematuria Ext: No pitting leg edema bilaterally. 1+DP/PT pulse bilaterally. Musculoskeletal: No joint deformities, No joint redness or warmth, no limitation of ROM in spin. Skin: No rashes.  Neuro: Patient is seen in comatose status, unresponsive, slightly moves extremities on painful stimuli. Psych: Patient is not psychotic  Labs on Admission: I have personally reviewed following labs and imaging studies  CBC: Recent Labs  Lab 01/23/2022 0451  WBC 7.8  NEUTROABS 7.1  HGB 12.0*  HCT 36.7*  MCV 101.1*  PLT [id number]   Basic Metabolic Panel: Recent Labs  Lab 01/13/2022 0451  NA 139  K 3.4*  CL 103  CO2 24  GLUCOSE 179*  BUN 20  CREATININE 1.21  CALCIUM 8.3*   GFR: Estimated Creatinine Clearance: 29 mL/min (by C-G formula based on SCr of 1.21 mg/dL). Liver Function Tests: Recent Labs  Lab 01/05/2022 0451  AST 25  ALT 11  ALKPHOS 88  BILITOT 0.6  PROT 6.3*  ALBUMIN 3.0*   Recent Labs  Lab 01/07/2022 0451  LIPASE 23   No results for input(s): AMMONIA in the last 168 hours. Coagulation Profile: Recent Labs  Lab 01/15/2022 0451  INR 1.0   Cardiac Enzymes: No results for input(s): CKTOTAL, CKMB, CKMBINDEX, TROPONINI in the last 168 hours. BNP (last 3 results) No results for input(s): PROBNP in the last 8760 hours. HbA1C: No results for input(s): HGBA1C in the last 72 hours. CBG: No results for input(s): GLUCAP in the last 168 hours. Lipid Profile: No results for input(s): CHOL, HDL, LDLCALC, TRIG, CHOLHDL, LDLDIRECT in the last 72 hours. Thyroid Function Tests: No results for  input(s): TSH, T4TOTAL, FREET4, T3FREE, THYROIDAB in the last 72 hours. Anemia Panel: No results for input(s): VITAMINB12, FOLATE, FERRITIN, TIBC, IRON, RETICCTPCT in the last 72 hours. Urine analysis:    Component Value Date/Time   COLORURINE AMBER (A) 01/26/2022 0451   APPEARANCEUR CLOUDY (A) 01/24/2022 0451   APPEARANCEUR Clear 08/20/2013 1422   LABSPEC 1.028 01/15/2022 0451   LABSPEC 1.030 08/20/2013 1422   PHURINE 5.0 01/16/2022 0451   GLUCOSEU NEGATIVE 01/08/2022 0451   GLUCOSEU Negative 08/20/2013 1422   HGBUR SMALL (A) 01/18/2022 0451   BILIRUBINUR NEGATIVE 01/26/2022 0451   BILIRUBINUR Negative 08/20/2013 1422   KETONESUR NEGATIVE 01/05/2022 0451   PROTEINUR 100 (A) 01/23/2022 0451   NITRITE NEGATIVE 01/26/2022 0451   LEUKOCYTESUR NEGATIVE 01/06/2022 0451   LEUKOCYTESUR Negative 08/20/2013 1422   Sepsis Labs: @LABRCNTIP (procalcitonin:4,lacticidven:4) ) Recent Results (from the past 240 hour(s))  Resp Panel by  RT-PCR (Flu A&B, Covid) Nasopharyngeal Swab     Status: None   Collection Time: 01/23/2022  4:51 AM   Specimen: Nasopharyngeal Swab; Nasopharyngeal(NP) swabs in vial transport medium  Result Value Ref Range Status   SARS Coronavirus 2 by RT PCR NEGATIVE NEGATIVE Final    Comment: (NOTE) SARS-CoV-2 target nucleic acids are NOT DETECTED.  The SARS-CoV-2 RNA is generally detectable in upper respiratory specimens during the acute phase of infection. The lowest concentration of SARS-CoV-2 viral copies this assay can detect is 138 copies/mL. A negative result does not preclude SARS-Cov-2 infection and should not be used as the sole basis for treatment or other patient management decisions. A negative result may occur with  improper specimen collection/handling, submission of specimen other than nasopharyngeal swab, presence of viral mutation(s) within the areas targeted by this assay, and inadequate number of viral copies(<138 copies/mL). A negative result must be  combined with clinical observations, patient history, and epidemiological information. The expected result is Negative.  Fact Sheet for Patients:  EntrepreneurPulse.com.au  Fact Sheet for Healthcare Providers:  IncredibleEmployment.be  This test is no t yet approved or cleared by the [place] FDA and  has been authorized for detection and/or diagnosis of SARS-CoV-2 by FDA under an Emergency Use Authorization (EUA). This EUA will remain  in effect (meaning this test can be used) for the duration of the COVID-19 declaration under Section 564(b)(1) of the Act, 21 U.S.C.section 360bbb-3(b)(1), unless the authorization is terminated  or revoked sooner.       Influenza A by PCR NEGATIVE NEGATIVE Final   Influenza B by PCR NEGATIVE NEGATIVE Final    Comment: (NOTE) The Xpert Xpress SARS-CoV-2/FLU/RSV plus assay is intended as an aid in the diagnosis of influenza from Nasopharyngeal swab specimens and should not be used as a sole basis for treatment. Nasal washings and aspirates are unacceptable for Xpert Xpress SARS-CoV-2/FLU/RSV testing.  Fact Sheet for Patients: EntrepreneurPulse.com.au  Fact Sheet for Healthcare Providers: IncredibleEmployment.be  This test is not yet approved or cleared by the [place] FDA and has been authorized for detection and/or diagnosis of SARS-CoV-2 by FDA under an Emergency Use Authorization (EUA). This EUA will remain in effect (meaning this test can be used) for the duration of the COVID-19 declaration under Section 564(b)(1) of the Act, 21 U.S.C. section 360bbb-3(b)(1), unless the authorization is terminated or revoked.  Performed at [facility], [address] [place], [place] [address]   Blood Culture (routine x 2)     Status: None (Preliminary result)   Collection Time: 01/23/2022  4:51 AM   Specimen: BLOOD  Result Value Ref Range Status   Specimen  Description BLOOD LEFT [facility]  Final   Special Requests   Final    BOTTLES DRAWN AEROBIC AND ANAEROBIC Blood Culture adequate volume   Culture   Final    NO GROWTH <12 HOURS Performed at [facility], [address], [place], [place] [address]    Report Status PENDING  Incomplete  Blood Culture (routine x 2)     Status: None (Preliminary result)   Collection Time: 01/26/2022  4:51 AM   Specimen: BLOOD  Result Value Ref Range Status   Specimen Description BLOOD LEFT FA  Final   Special Requests   Final    BOTTLES DRAWN AEROBIC AND ANAEROBIC Blood Culture results may not be optimal due to an excessive volume of blood received in culture bottles   Culture   Final    NO GROWTH <  12 HOURS Performed at [facility], [place], [place] [address]    Report Status PENDING  Incomplete     Radiological Exams on Admission: CT HEAD WO CONTRAST (5MM)  Result Date: 01/15/2022 CLINICAL DATA:  88 year old male with history of altered mental status. Nausea, vomiting, diarrhea and generalized weakness. EXAM: CT HEAD WITHOUT CONTRAST TECHNIQUE: Contiguous axial images were obtained from the base of the skull through the vertex without intravenous contrast. RADIATION DOSE REDUCTION: This exam was performed according to the departmental dose-optimization program which includes automated exposure control, adjustment of the mA and/or kV according to patient size and/or use of iterative reconstruction technique. COMPARISON:  Head CT 04/20/2021. FINDINGS: Brain: Moderate cerebral and cerebellar atrophy. Patchy and confluent areas of decreased attenuation are noted throughout the deep and periventricular white matter of the cerebral hemispheres bilaterally, compatible with chronic microvascular ischemic disease. Small well-defined areas of low attenuation in the cerebellar hemispheres bilaterally, indicative of old lacunar infarcts. A small lacunar infarct is also noted in the right caudate  nucleus as well. Large area of low attenuation and volume loss in the right temporal and parietal regions, compatible with encephalomalacia from remote right MCA territory infarct, similar to the prior examination. No evidence of acute infarction, hemorrhage, hydrocephalus, extra-axial collection or mass lesion/mass effect. Vascular: Numerous atherosclerotic calcifications are noted throughout the cerebral vasculature. Skull: Normal. Negative for fracture or focal lesion. Sinuses/Orbits: No acute finding. Other: None. IMPRESSION: 1. No acute intracranial abnormalities. 2. Moderate cerebral and cerebellar atrophy with extensive chronic microvascular ischemic changes in the cerebral white matter, old basal ganglia and bilateral cerebellar lacunar infarcts, and old right MCA territory infarct, as above. Electronically Signed   By: Vinnie Langton M.D.   On:  06:19   CT Angio Chest PE W and/or Wo Contrast  Result Date: 01/23/2022 CLINICAL DATA:  Nausea, vomiting, and diarrhea with generalized weakness. Sepsis. EXAM: CT ANGIOGRAPHY CHEST CT ABDOMEN AND PELVIS WITH CONTRAST TECHNIQUE: Multidetector CT imaging of the chest was performed using the standard protocol during bolus administration of intravenous contrast. Multiplanar CT image reconstructions and MIPs were obtained to evaluate the vascular anatomy. Multidetector CT imaging of the abdomen and pelvis was performed using the standard protocol during bolus administration of intravenous contrast. RADIATION DOSE REDUCTION: This exam was performed according to the departmental dose-optimization program which includes automated exposure control, adjustment of the mA and/or kV according to patient size and/or use of iterative reconstruction technique. CONTRAST:  16mL OMNIPAQUE IOHEXOL 350 MG/ML SOLN COMPARISON:  Abdomen and pelvis CT 08/20/2013 FINDINGS: CTA CHEST FINDINGS Cardiovascular: Satisfactory opacification of the pulmonary arteries to the segmental  level. No evidence of pulmonary embolism when allowing for levels of motion artifact. Normal heart size. No pericardial effusion. Aortic and coronary atherosclerosis. Mediastinum/Nodes: Enlarged subcarinal lymph nodes measuring up to 15 mm, likely reactive to the pulmonary findings Lungs/Pleura: Patchy bilateral pulmonary infiltrate. Centrilobular emphysema. Heavily, coarsely calcified nodule in the lingula with benign appearance, up to 2.5 cm. Musculoskeletal: Remote T8 and T9 compression fractures. Review of the MIP images confirms the above findings. CT ABDOMEN and PELVIS FINDINGS Hepatobiliary: No focal liver abnormality.Nonenhancing portal veins in the inferior segment right liver. No visible associated mass. Cholelithiasis but no gallbladder over distension or suspected acute cholecystitis. Pancreas: Generalized atrophy Spleen: Unremarkable. Adrenals/Urinary Tract: Negative adrenals. No hydronephrosis or ureteral stone. Two left renal calculi measuring up to 9 mm at the lower pole. Punctate right interpolar renal calculus. Unremarkable, collapsed bladder. Stomach/Bowel: No obstruction. No  visible bowel inflammation. Mild colonic diverticulosis. Vascular/Lymphatic: No acute vascular abnormality. Fusiform abdominal aortic aneurysm with mural thrombus. Dimensions measure up to 5.3 cm. Calcified plaque at the visceral ostia with greatest narrowing at the celiac origin. Major vessels are enhancing. No mass or adenopathy. Reproductive:Symmetric prostate enlargement, incidental. Other: No ascites or pneumoperitoneum.  Fatty left groin hernia. Musculoskeletal: No acute abnormalities. Generalized lumbar spine degeneration with L4-5 anterolisthesis. Review of the MIP images confirms the above findings. IMPRESSION: Chest CTA: 1. Generalized airspace disease consistent with multifocal pneumonia. 2. Negative for pulmonary embolism. 3. Aortic Atherosclerosis (ICD10-I70.0) and Emphysema (ICD10-J43.9). Abdominal CT: 1.  Intrahepatic portal venous thrombosis in the inferior right lobe. No definite underlying cause. 2. 5.3 cm fusiform abdominal aortic aneurysm. Recommend referral to a vascular specialist if appropriate for comorbidities. This recommendation follows ACR consensus guidelines: White Paper of the ACR Incidental Findings Committee II on Vascular Findings. J Am Coll Radiol 2013; 10:789-794. 3. Cholelithiasis and nephrolithiasis Electronically Signed   By: Jorje Guild M.D.   On: 01/21/2022 06:33   CT ABDOMEN PELVIS W CONTRAST  Result Date: 01/27/2022 CLINICAL DATA:  Nausea, vomiting, and diarrhea with generalized weakness. Sepsis. EXAM: CT ANGIOGRAPHY CHEST CT ABDOMEN AND PELVIS WITH CONTRAST TECHNIQUE: Multidetector CT imaging of the chest was performed using the standard protocol during bolus administration of intravenous contrast. Multiplanar CT image reconstructions and MIPs were obtained to evaluate the vascular anatomy. Multidetector CT imaging of the abdomen and pelvis was performed using the standard protocol during bolus administration of intravenous contrast. RADIATION DOSE REDUCTION: This exam was performed according to the departmental dose-optimization program which includes automated exposure control, adjustment of the mA and/or kV according to patient size and/or use of iterative reconstruction technique. CONTRAST:  74mL OMNIPAQUE IOHEXOL 350 MG/ML SOLN COMPARISON:  Abdomen and pelvis CT 08/20/2013 FINDINGS: CTA CHEST FINDINGS Cardiovascular: Satisfactory opacification of the pulmonary arteries to the segmental level. No evidence of pulmonary embolism when allowing for levels of motion artifact. Normal heart size. No pericardial effusion. Aortic and coronary atherosclerosis. Mediastinum/Nodes: Enlarged subcarinal lymph nodes measuring up to 15 mm, likely reactive to the pulmonary findings Lungs/Pleura: Patchy bilateral pulmonary infiltrate. Centrilobular emphysema. Heavily, coarsely calcified nodule  in the lingula with benign appearance, up to 2.5 cm. Musculoskeletal: Remote T8 and T9 compression fractures. Review of the MIP images confirms the above findings. CT ABDOMEN and PELVIS FINDINGS Hepatobiliary: No focal liver abnormality.Nonenhancing portal veins in the inferior segment right liver. No visible associated mass. Cholelithiasis but no gallbladder over distension or suspected acute cholecystitis. Pancreas: Generalized atrophy Spleen: Unremarkable. Adrenals/Urinary Tract: Negative adrenals. No hydronephrosis or ureteral stone. Two left renal calculi measuring up to 9 mm at the lower pole. Punctate right interpolar renal calculus. Unremarkable, collapsed bladder. Stomach/Bowel: No obstruction. No visible bowel inflammation. Mild colonic diverticulosis. Vascular/Lymphatic: No acute vascular abnormality. Fusiform abdominal aortic aneurysm with mural thrombus. Dimensions measure up to 5.3 cm. Calcified plaque at the visceral ostia with greatest narrowing at the celiac origin. Major vessels are enhancing. No mass or adenopathy. Reproductive:Symmetric prostate enlargement, incidental. Other: No ascites or pneumoperitoneum.  Fatty left groin hernia. Musculoskeletal: No acute abnormalities. Generalized lumbar spine degeneration with L4-5 anterolisthesis. Review of the MIP images confirms the above findings. IMPRESSION: Chest CTA: 1. Generalized airspace disease consistent with multifocal pneumonia. 2. Negative for pulmonary embolism. 3. Aortic Atherosclerosis (ICD10-I70.0) and Emphysema (ICD10-J43.9). Abdominal CT: 1. Intrahepatic portal venous thrombosis in the inferior right lobe. No definite underlying cause. 2. 5.3 cm fusiform abdominal aortic aneurysm. Recommend referral  to a vascular specialist if appropriate for comorbidities. This recommendation follows ACR consensus guidelines: White Paper of the ACR Incidental Findings Committee II on Vascular Findings. J Am Coll Radiol 2013; 10:789-794. 3.  Cholelithiasis and nephrolithiasis Electronically Signed   By: Jorje Guild M.D.   On: 01/25/2022 06:33   DG Chest Port 1 View  Result Date: 01/09/2022 CLINICAL DATA:  88 year old male with possible sepsis. Weakness and nausea. EXAM: PORTABLE CHEST 1 VIEW COMPARISON:  Chest x-ray 04/20/2021. FINDINGS: Lung volumes are low. Again noted are widespread areas of interstitial prominence and patchy ill-defined opacities scattered throughout the lungs bilaterally, most pronounced in the mid to lower lungs, very similar to prior study from 2022, favored to reflect chronic pulmonary fibrosis. Large calcified granuloma in the inferior aspect of the lingula. No pleural effusions. No pneumothorax. No evidence of pulmonary edema. Heart size is normal. Upper mediastinal contours are within normal limits. Atherosclerotic calcifications in the thoracic aorta. IMPRESSION: 1. The appearance of the chest is strongly favored to reflect chronic interstitial lung disease, as above. The possibility of superimposed acute infection is not entirely excluded, but not strongly favored. Further evaluation with high-resolution chest CT is recommended at this time. 2. Aortic atherosclerosis. Electronically Signed   By: Vinnie [place] M.D.   On: 01/27/2022 05:16      Assessment/Plan Principal Problem:   Comfort measures only status Active Problems:   Macrocytic anemia   Multifocal pneumonia   Nausea vomiting and diarrhea   Severe sepsis with septic shock (CODE) (HCC)   Hypokalemia   Portal vein thrombosis   Acute respiratory failure with hypoxia (HCC)   Tobacco use   Chronic diastolic CHF (congestive heart failure) ([place])   Stroke ([place])   Coma ([place])   AKI (acute kidney injury) ([place])   AAA (abdominal aortic aneurysm)   Comfort measures only status: Patient has multiple chronic comorbidities.  Presents with severe sepsis with septic shock due to multifocal pneumonia.  Also has acute respiratory failure with hypoxia.   Patient was treated with aggressive IV fluid resuscitation and broad antibiotics, unfortunately patient has persistent hypotension due to septic shock. His prognosis is extremely poor. I have had extensive discussion with his son, who is very supportive. He agreed for comfort care now, which is reasonable given his progressive decline in ED. Pt will be DNR.  -Will admit to regular bed for comfort care. -Stop drawing labs and IVF -prn Ativan for agitation -As needed morphine for pain -Naphazoline 0.1 % ophthalmic solution:  Prn for eye irritation -scopolamine 1.5 MG patch -Psycho/Social: emotional support offered to family at bedside -consult to palliterative care  Other active problems as listed below, will not continue home medications, will focus on comfort care now.   Macrocytic anemia   Multifocal pneumonia   Nausea vomiting and diarrhea   Severe sepsis with septic shock (CODE) (HCC)   Hypokalemia   Portal vein thrombosis   Acute respiratory failure with hypoxia (HCC)   Tobacco use   Chronic diastolic CHF (congestive heart failure) ([place])   Stroke ([place])   Coma (HCC)   AKI (acute kidney injury) ([place])   AAA (abdominal aortic aneurysm)    DVT ppx: none  Code Status: DNR per his son  Family Communication:   Yes, patient's son at bed side.     Disposition Plan:  to be determined  Consults called:  none  Admission status and Level of care: Med-Surg:   as inpt       Severity of  Illness:  The appropriate patient status for this patient is INPATIENT. Inpatient status is judged to be reasonable and necessary in order to provide the required intensity of service to ensure the patient's safety. The patient's presenting symptoms, physical exam findings, and initial radiographic and laboratory data in the context of their chronic comorbidities is felt to place them at high risk for further clinical deterioration. Furthermore, it is not anticipated that the patient will be medically  stable for discharge from the hospital within 2 midnights of admission.   * I certify that at the point of admission it is my clinical judgment that the patient will require inpatient hospital care spanning beyond 2 midnights from the point of admission due to high intensity of service, high risk for further deterioration and high frequency of surveillance required.*       Date of Service 01/30/2022    Ivor Costa [facility]   If 7PM-7AM, please contact night-coverage [email or web address] 01/09/2022, 12:07 PM

## 2022-01-27 NOTE — Progress Notes (Signed)
PHARMACY -  BRIEF ANTIBIOTIC NOTE   Pharmacy has received consult(s) for Cefepime and Vancomycin from an ED provider.  The patient's profile has been reviewed for ht/wt/allergies/indication/available labs.    One time order(s) placed for Cefepime 2 gm and Vancomycin 1 gm based on most recent pt wt of 56 kg on 09/20/21  Further antibiotics/pharmacy consults should be ordered by admitting physician if indicated.                       Thank you, Otelia Sergeant, PharmD, [facility] 01/27/2022 4:53 AM

## 2022-01-27 NOTE — ED Notes (Signed)
Pt getting increasingly agitated. EDT and family at bedside to redirect and calm pt.

## 2022-01-27 NOTE — ED Notes (Signed)
RN at bedside. Pt's legs through side rails and attempting to get up. Pt redirected by staff at this time. Pt repeatedly stating, "water." RN provided pt with mouth swab and NRB placed back on pt. Seizure pads placed on side rails to ensure pt stays comfortably in bed.

## 2022-01-27 NOTE — ED Provider Notes (Addendum)
[facility] Provider Note    Event Date/Time   First MD Initiated Contact with Patient 01/14/2022 [phone]     (approximate)   History   Weakness and Nausea   HPI  Alex BURDETTE is a 88 y.o. male history of previous stroke in 1970s, cognitive decline who presents to the emergency department EMS for nausea, vomiting and diarrhea.  Was found to be hypotensive, tachycardic, hypoxic to 80% on room air.  Febrile to 102.5 here.  Patient is yelling out intermittently and seems to be very hard of hearing.  Difficult to get a history from.  Patient lives at home alone.  Son is on his way per EMS.   Spoke with Natividad Brood, patient's son by phone.  Son is the healthcare power of attorney.  Patient lives at home and has care givers. Started having vomiting and diarrhea around 10:30 pm last night and then became so weak that he could not walk.  He states patient is really hard of hearing and has "early signs of dementia."  Son states he is a DNR/DNI.  He is a smoker but does not wear O2 at home.   History provided by EMS, son by phone.  Level 5 caveat due to confusion.    Past Medical History:  Diagnosis Date   H/O hernia repair 2017   Stroke [facility]) 1978    History reviewed. No pertinent surgical history.  MEDICATIONS:  Prior to Admission medications   Medication Sig Start Date End Date Taking? Authorizing Provider  acetaminophen (TYLENOL) 325 MG tablet Take 650 mg by mouth every 4 (four) hours as needed for pain.    [provider]    Physical Exam   Triage Vital Signs: ED Triage Vitals [01/14/2022 0446]  Enc Vitals Group     BP (!) 131/119     Pulse Rate (!) 114     Resp (!) 25     Temp (!) 102.5 F (39.2 C)     Temp Source Rectal     SpO2 (!) 88 %     Weight      Height      Head Circumference      Peak Flow      Pain Score      Pain Loc      Pain Edu?      Excl. in [place]?     Most recent vital signs: Vitals:   01/04/2022 0657 01/05/2022  0700  BP:  116/63  Pulse:  (!) 113  Resp:  (!) 26  Temp: 100.2 F (37.9 C)   SpO2:  100%    CONSTITUTIONAL: Alert and will open eyes but does not answer questions or follow commands.  Intermittently yelling out.  Elderly and thin. HEAD: Normocephalic, atraumatic EYES: Conjunctivae clear, pupils appear equal, sclera nonicteric ENT: normal nose; moist mucous membranes NECK: Supple, normal ROM CARD: Regular and tachycardic; S1 and S2 appreciated; no murmurs, no clicks, no rubs, no gallops RESP: Normal chest excursion without splinting or tachypnea; breath sounds clear and equal bilaterally; no wheezes, no rhonchi, no rales, no hypoxia or respiratory distress, speaking full sentences ABD/GI: Normal bowel sounds; non-distended; soft, non-tender, no rebound, no guarding, no peritoneal signs BACK: The back appears normal, no rash or other lesions appreciated, no sacral wounds, no gross blood or melena on rectal exam EXT: Normal ROM in all joints; no deformity noted, no edema; no cyanosis SKIN: Normal color for age and race; warm; no rash on  exposed skin NEURO: Moves all extremities equally, normal speech PSYCH: The patient's mood and manner are appropriate.   ED Results / Procedures / Treatments   LABS: (all labs ordered are listed, but only abnormal results are displayed) Labs Reviewed  LACTIC ACID, PLASMA - Abnormal; Notable for the following components:      Result Value   Lactic Acid, Venous 4.3 (*)    All other components within normal limits  COMPREHENSIVE METABOLIC PANEL - Abnormal; Notable for the following components:   Potassium 3.4 (*)    Glucose, Bld 179 (*)    Calcium 8.3 (*)    Total Protein 6.3 (*)    Albumin 3.0 (*)    GFR, Estimated 55 (*)    All other components within normal limits  CBC WITH DIFFERENTIAL/PLATELET - Abnormal; Notable for the following components:   RBC 3.63 (*)    Hemoglobin 12.0 (*)    HCT 36.7 (*)    MCV 101.1 (*)    Lymphs Abs 0.4 (*)     All other components within normal limits  URINALYSIS, COMPLETE (UACMP) WITH MICROSCOPIC - Abnormal; Notable for the following components:   Color, Urine AMBER (*)    APPearance CLOUDY (*)    Hgb urine dipstick SMALL (*)    Protein, ur 100 (*)    Bacteria, UA FEW (*)    All other components within normal limits  BLOOD GAS, VENOUS - Abnormal; Notable for the following components:   pO2, Ven <31 (*)    All other components within normal limits  D-DIMER, QUANTITATIVE - Abnormal; Notable for the following components:   D-Dimer, Quant 8.90 (*)    All other components within normal limits  LACTIC ACID, PLASMA - Abnormal; Notable for the following components:   Lactic Acid, Venous 3.8 (*)    All other components within normal limits  RESP PANEL BY RT-PCR (FLU A&B, COVID) ARPGX2  CULTURE, BLOOD (ROUTINE X 2)  CULTURE, BLOOD (ROUTINE X 2)  URINE CULTURE  PROTIME-INR  APTT  BRAIN NATRIURETIC PEPTIDE  LIPASE, BLOOD  TROPONIN I (HIGH SENSITIVITY)  TROPONIN I (HIGH SENSITIVITY)     EKG:  EKG Interpretation  Date/Time:  Friday January 27 2022 04:45:00 EST Ventricular Rate:  118 PR Interval:  141 QRS Duration: 92 QT Interval:  333 QTC Calculation: 461 R Axis:   -36 Text Interpretation: Sinus tachycardia Ventricular premature complex Inferior infarct, old Anterior infarct, old Baseline wander in lead(s) I III aVL Confirmed by Pryor Curia [phone]) on 01/20/2022 5:49:27 AM         RADIOLOGY: My personal review and interpretation of imaging: CT head shows no acute abnormality.  CT chest and chest x-ray show pneumonia.  No PE.  CT abdomen pelvis shows a portal venous thrombosis.  I have personally reviewed all radiology reports.   CT HEAD WO CONTRAST (5MM)  Result Date: 01/27/2022 CLINICAL DATA:  88 year old male with history of altered mental status. Nausea, vomiting, diarrhea and generalized weakness. EXAM: CT HEAD WITHOUT CONTRAST TECHNIQUE: Contiguous axial images were obtained  from the base of the skull through the vertex without intravenous contrast. RADIATION DOSE REDUCTION: This exam was performed according to the departmental dose-optimization program which includes automated exposure control, adjustment of the mA and/or kV according to patient size and/or use of iterative reconstruction technique. COMPARISON:  Head CT 04/20/2021. FINDINGS: Brain: Moderate cerebral and cerebellar atrophy. Patchy and confluent areas of decreased attenuation are noted throughout the deep and periventricular white matter of the cerebral hemispheres  bilaterally, compatible with chronic microvascular ischemic disease. Small well-defined areas of low attenuation in the cerebellar hemispheres bilaterally, indicative of old lacunar infarcts. A small lacunar infarct is also noted in the right caudate nucleus as well. Large area of low attenuation and volume loss in the right temporal and parietal regions, compatible with encephalomalacia from remote right MCA territory infarct, similar to the prior examination. No evidence of acute infarction, hemorrhage, hydrocephalus, extra-axial collection or mass lesion/mass effect. Vascular: Numerous atherosclerotic calcifications are noted throughout the cerebral vasculature. Skull: Normal. Negative for fracture or focal lesion. Sinuses/Orbits: No acute finding. Other: None. IMPRESSION: 1. No acute intracranial abnormalities. 2. Moderate cerebral and cerebellar atrophy with extensive chronic microvascular ischemic changes in the cerebral white matter, old basal ganglia and bilateral cerebellar lacunar infarcts, and old right MCA territory infarct, as above. Electronically Signed   By: Vinnie Langton M.D.   On: 01/21/2022 06:19   CT Angio Chest PE W and/or Wo Contrast  Result Date: 01/18/2022 CLINICAL DATA:  Nausea, vomiting, and diarrhea with generalized weakness. Sepsis. EXAM: CT ANGIOGRAPHY CHEST CT ABDOMEN AND PELVIS WITH CONTRAST TECHNIQUE: Multidetector CT  imaging of the chest was performed using the standard protocol during bolus administration of intravenous contrast. Multiplanar CT image reconstructions and MIPs were obtained to evaluate the vascular anatomy. Multidetector CT imaging of the abdomen and pelvis was performed using the standard protocol during bolus administration of intravenous contrast. RADIATION DOSE REDUCTION: This exam was performed according to the departmental dose-optimization program which includes automated exposure control, adjustment of the mA and/or kV according to patient size and/or use of iterative reconstruction technique. CONTRAST:  20mL OMNIPAQUE IOHEXOL 350 MG/ML SOLN COMPARISON:  Abdomen and pelvis CT 08/20/2013 FINDINGS: CTA CHEST FINDINGS Cardiovascular: Satisfactory opacification of the pulmonary arteries to the segmental level. No evidence of pulmonary embolism when allowing for levels of motion artifact. Normal heart size. No pericardial effusion. Aortic and coronary atherosclerosis. Mediastinum/Nodes: Enlarged subcarinal lymph nodes measuring up to 15 mm, likely reactive to the pulmonary findings Lungs/Pleura: Patchy bilateral pulmonary infiltrate. Centrilobular emphysema. Heavily, coarsely calcified nodule in the lingula with benign appearance, up to 2.5 cm. Musculoskeletal: Remote T8 and T9 compression fractures. Review of the MIP images confirms the above findings. CT ABDOMEN and PELVIS FINDINGS Hepatobiliary: No focal liver abnormality.Nonenhancing portal veins in the inferior segment right liver. No visible associated mass. Cholelithiasis but no gallbladder over distension or suspected acute cholecystitis. Pancreas: Generalized atrophy Spleen: Unremarkable. Adrenals/Urinary Tract: Negative adrenals. No hydronephrosis or ureteral stone. Two left renal calculi measuring up to 9 mm at the lower pole. Punctate right interpolar renal calculus. Unremarkable, collapsed bladder. Stomach/Bowel: No obstruction. No visible bowel  inflammation. Mild colonic diverticulosis. Vascular/Lymphatic: No acute vascular abnormality. Fusiform abdominal aortic aneurysm with mural thrombus. Dimensions measure up to 5.3 cm. Calcified plaque at the visceral ostia with greatest narrowing at the celiac origin. Major vessels are enhancing. No mass or adenopathy. Reproductive:Symmetric prostate enlargement, incidental. Other: No ascites or pneumoperitoneum.  Fatty left groin hernia. Musculoskeletal: No acute abnormalities. Generalized lumbar spine degeneration with L4-5 anterolisthesis. Review of the MIP images confirms the above findings. IMPRESSION: Chest CTA: 1. Generalized airspace disease consistent with multifocal pneumonia. 2. Negative for pulmonary embolism. 3. Aortic Atherosclerosis (ICD10-I70.0) and Emphysema (ICD10-J43.9). Abdominal CT: 1. Intrahepatic portal venous thrombosis in the inferior right lobe. No definite underlying cause. 2. 5.3 cm fusiform abdominal aortic aneurysm. Recommend referral to a vascular specialist if appropriate for comorbidities. This recommendation follows ACR consensus guidelines: White Paper of the  ACR Incidental Findings Committee II on Vascular Findings. J Am Coll Radiol 2013; 10:789-794. 3. Cholelithiasis and nephrolithiasis Electronically Signed   By: Jorje Guild M.D.   On: 01/05/2022 06:33   CT ABDOMEN PELVIS W CONTRAST  Result Date: 01/10/2022 CLINICAL DATA:  Nausea, vomiting, and diarrhea with generalized weakness. Sepsis. EXAM: CT ANGIOGRAPHY CHEST CT ABDOMEN AND PELVIS WITH CONTRAST TECHNIQUE: Multidetector CT imaging of the chest was performed using the standard protocol during bolus administration of intravenous contrast. Multiplanar CT image reconstructions and MIPs were obtained to evaluate the vascular anatomy. Multidetector CT imaging of the abdomen and pelvis was performed using the standard protocol during bolus administration of intravenous contrast. RADIATION DOSE REDUCTION: This exam was  performed according to the departmental dose-optimization program which includes automated exposure control, adjustment of the mA and/or kV according to patient size and/or use of iterative reconstruction technique. CONTRAST:  23mL OMNIPAQUE IOHEXOL 350 MG/ML SOLN COMPARISON:  Abdomen and pelvis CT 08/20/2013 FINDINGS: CTA CHEST FINDINGS Cardiovascular: Satisfactory opacification of the pulmonary arteries to the segmental level. No evidence of pulmonary embolism when allowing for levels of motion artifact. Normal heart size. No pericardial effusion. Aortic and coronary atherosclerosis. Mediastinum/Nodes: Enlarged subcarinal lymph nodes measuring up to 15 mm, likely reactive to the pulmonary findings Lungs/Pleura: Patchy bilateral pulmonary infiltrate. Centrilobular emphysema. Heavily, coarsely calcified nodule in the lingula with benign appearance, up to 2.5 cm. Musculoskeletal: Remote T8 and T9 compression fractures. Review of the MIP images confirms the above findings. CT ABDOMEN and PELVIS FINDINGS Hepatobiliary: No focal liver abnormality.Nonenhancing portal veins in the inferior segment right liver. No visible associated mass. Cholelithiasis but no gallbladder over distension or suspected acute cholecystitis. Pancreas: Generalized atrophy Spleen: Unremarkable. Adrenals/Urinary Tract: Negative adrenals. No hydronephrosis or ureteral stone. Two left renal calculi measuring up to 9 mm at the lower pole. Punctate right interpolar renal calculus. Unremarkable, collapsed bladder. Stomach/Bowel: No obstruction. No visible bowel inflammation. Mild colonic diverticulosis. Vascular/Lymphatic: No acute vascular abnormality. Fusiform abdominal aortic aneurysm with mural thrombus. Dimensions measure up to 5.3 cm. Calcified plaque at the visceral ostia with greatest narrowing at the celiac origin. Major vessels are enhancing. No mass or adenopathy. Reproductive:Symmetric prostate enlargement, incidental. Other: No ascites or  pneumoperitoneum.  Fatty left groin hernia. Musculoskeletal: No acute abnormalities. Generalized lumbar spine degeneration with L4-5 anterolisthesis. Review of the MIP images confirms the above findings. IMPRESSION: Chest CTA: 1. Generalized airspace disease consistent with multifocal pneumonia. 2. Negative for pulmonary embolism. 3. Aortic Atherosclerosis (ICD10-I70.0) and Emphysema (ICD10-J43.9). Abdominal CT: 1. Intrahepatic portal venous thrombosis in the inferior right lobe. No definite underlying cause. 2. 5.3 cm fusiform abdominal aortic aneurysm. Recommend referral to a vascular specialist if appropriate for comorbidities. This recommendation follows ACR consensus guidelines: White Paper of the ACR Incidental Findings Committee II on Vascular Findings. J Am Coll Radiol 2013; 10:789-794. 3. Cholelithiasis and nephrolithiasis Electronically Signed   By: Jorje Guild M.D.   On: 01/25/2022 06:33   DG Chest Port 1 View  Result Date: 01/08/2022 CLINICAL DATA:  88 year old male with possible sepsis. Weakness and nausea. EXAM: PORTABLE CHEST 1 VIEW COMPARISON:  Chest x-ray 04/20/2021. FINDINGS: Lung volumes are low. Again noted are widespread areas of interstitial prominence and patchy ill-defined opacities scattered throughout the lungs bilaterally, most pronounced in the mid to lower lungs, very similar to prior study from 2022, favored to reflect chronic pulmonary fibrosis. Large calcified granuloma in the inferior aspect of the lingula. No pleural effusions. No pneumothorax. No evidence of pulmonary edema.  Heart size is normal. Upper mediastinal contours are within normal limits. Atherosclerotic calcifications in the thoracic aorta. IMPRESSION: 1. The appearance of the chest is strongly favored to reflect chronic interstitial lung disease, as above. The possibility of superimposed acute infection is not entirely excluded, but not strongly favored. Further evaluation with high-resolution chest CT is  recommended at this time. 2. Aortic atherosclerosis. Electronically Signed   By: Vinnie Langton M.D.   On: 01/15/2022 05:16     PROCEDURES:  Critical Care performed: Yes, see critical care procedure note(s)   CRITICAL CARE Performed by: Cyril Mourning Emine Lopata   Total critical care time: 65 minutes  Critical care time was exclusive of separately billable procedures and treating other patients.  Critical care was necessary to treat or prevent imminent or life-threatening deterioration.  Critical care was time spent personally by me on the following activities: development of treatment plan with patient and/or surrogate as well as nursing, discussions with consultants, evaluation of patient's response to treatment, examination of patient, obtaining history from patient or surrogate, ordering and performing treatments and interventions, ordering and review of laboratory studies, ordering and review of radiographic studies, pulse oximetry and re-evaluation of patient's condition.   Marland Kitchen1-3 Lead EKG Interpretation Performed by: Carmesha Morocco, Delice Bison, DO Authorized by: Brytney Somes, Delice Bison, DO     Interpretation: abnormal     ECG rate:  115   ECG rate assessment: tachycardic     Rhythm: sinus tachycardia     Ectopy: none     Conduction: normal      IMPRESSION / MDM / ASSESSMENT AND PLAN / ED COURSE  I reviewed the triage vital signs and the nursing notes.    Patient here as a code sepsis.  He is tachycardic, tachypneic, hypoxic and hypotensive with EMS.  Slightly hypertensive on our evaluation.  He seems confused but it is unclear what his baseline is.  There is no family at the bedside.  The patient is on the cardiac monitor to evaluate for evidence of arrhythmia and/or significant heart rate changes.   DIFFERENTIAL DIAGNOSIS (includes but not limited to):   Sepsis from UTI, bacteremia, pneumonia, COVID-19, influenza.  Differential also includes dehydration, anemia, electrolyte derangement, CVA,  intracranial hemorrhage, encephalitis, meningitis, PE, ACS, dissection, appendicitis, colitis, bowel obstruction, diverticulitis, cholecystitis, pancreatitis, volvulus, viral gastroenteritis.   PLAN: We will obtain CBC, CMP, lipase, troponin, D-dimer, BNP, urinalysis, cultures, chest x-ray, CT head, CT of the chest as well as abdomen pelvis.  Will give 30 mL/kg IV fluid bolus based on his estimated weight of 60 kg.  Will give broad-spectrum antibiotics.  Will give Tylenol.  Patient will need admission.   MEDICATIONS GIVEN IN ED: Medications  lactated ringers infusion ( Intravenous New Bag/Given 01/16/2022 0509)  vancomycin (VANCOCIN) IVPB 1000 mg/200 mL premix (has no administration in time range)  LORazepam (ATIVAN) injection 0.5 mg (has no administration in time range)  heparin bolus via infusion 3,300 Units (has no administration in time range)  heparin ADULT infusion 100 units/mL (25000 units/232mL) (has no administration in time range)  lactated ringers bolus 1,000 mL (0 mLs Intravenous Stopped 01/04/2022 0609)    And  lactated ringers bolus 1,000 mL (1,000 mLs Intravenous New Bag/Given 01/12/2022 0620)  ceFEPIme (MAXIPIME) 2 g in sodium chloride 0.9 % 100 mL IVPB (0 g Intravenous Stopped 01/31/2022 0609)  metroNIDAZOLE (FLAGYL) IVPB 500 mg (500 mg Intravenous New Bag/Given 01/07/2022 0620)  acetaminophen (TYLENOL) tablet 1,000 mg (1,000 mg Oral Given 01/27/22 0516)  LORazepam (ATIVAN) injection  0.5 mg (0.5 mg Intravenous Given 01/12/2022 0548)  iohexol (OMNIPAQUE) 350 MG/ML injection 80 mL (80 mLs Intravenous Contrast Given 01/13/2022 0606)  lactated ringers bolus 1,000 mL (1,000 mLs Intravenous New Bag/Given 01/05/2022 0621)     ED COURSE: Patient's labs show no leukocytosis or leukopenia.  Hemoglobin is 12.0.  Normal electrolytes.  Kidney function minimally elevated at 1.2.  Normal LFTs and lipase.  Troponin negative.  BNP normal.  D-dimer elevated.  Lactic is 4.3.  COVID and flu negative.  Chest x-ray  reviewed by myself and radiologist and shows possible pneumonia.  We will proceed with CT imaging.   CTs of the head, chest, abdomen pelvis reviewed by myself and radiologist.  CT head shows no acute intracranial abnormality.  No infarct, bleeding.  CTA of the chest shows no PE but does show multifocal pneumonia.  He does have a new oxygen requirement here.  He is getting broad-spectrum antibiotics.  CT of the abdomen pelvis also shows a portal venous thrombosis in the inferior right lobe as well as an abdominal aortic aneurysm without sign of rupture or dissection.  We will start him on heparin.  I did have a lengthy discussion with his son by phone that I am very concerned that patient is severely septic and his prognosis is poor.  Son states he will come to the emergency department to be with the patient.  He would like to continue work-up and treatment at this time but states patient is a DNR/DNI.  We did discuss the option of comfort care as well.  Patient initially hypotensive but is responding to fluids.  Will discuss with hospitalist.  Does not need ICU as family does not want to escalate care at this time.   CONSULTS:   Consulted and discussed patient's case with hospitalist, Dr. Blaine Hamper.  I have recommended admission and consulting physician agrees and will place admission orders.  Patient (and family if present) agree with this plan.   I reviewed all nursing notes, vitals, pertinent previous records.  All labs, EKGs, imaging ordered have been independently reviewed and interpreted by myself.    OUTSIDE RECORDS REVIEWED:  Reviewed previous office visit with Dr. Maryland Pink on 03/03/21.        FINAL CLINICAL IMPRESSION(S) / ED DIAGNOSES   Final diagnoses:  Acute sepsis ([place])  Acute respiratory failure with hypoxia ([facility])  Multifocal pneumonia  Thrombosis     Rx / DC Orders   ED Discharge Orders     None        Note:  This document was prepared using Dragon voice  recognition software and may include unintentional dictation errors.   Nikolaj Geraghty, Delice Bison, DO 01/07/2022 [place], Delice Bison, DO 01/07/2022 [phone]

## 2022-01-27 NOTE — Sepsis Progress Note (Signed)
Following per sepsis protocol   

## 2022-01-27 NOTE — ED Notes (Addendum)
Palliative care at bedside. This RN discussed POC with Palliative care. Samara Deist from palliative care states she will call pt's son and discuss plan of care, but for now leave pt. On O2, and we will wean pt off and start morphine drip once son arrives. This RN acknowledge plan and will await instructions for weaning pt. Off O2 and initiating morphine drip.

## 2022-01-27 NOTE — Progress Notes (Signed)
Progress note: I spoke with son Roe Coombs over the phone and gave medical update. He is not HCPOA but is in emergency contacts. Information given and no changes to plan of care made.   If any decision needs to be made please contact patients other son/HCPOA.   Georgiann Cocker, NP Palliative Medicine Team   No charge

## 2022-01-27 NOTE — Progress Notes (Signed)
ANTICOAGULATION CONSULT NOTE   Pharmacy Consult for heparin infusion Indication: portal venous thrombosis in right lobe  No Known Allergies  Patient Measurements: Height: 5\' 8"  (172.7 cm) Weight: 55 kg (121 lb 4.1 oz) IBW/kg (Calculated) : 68.4 Heparin Dosing Weight: 55 kg  Vital Signs: Temp: 100.2 F (37.9 C) (02/24 0657) Temp Source: Rectal (02/24 0657) BP: 116/63 (02/24 0700) Pulse Rate: 113 (02/24 0700)  Labs: Recent Labs    01/11/2022 0451  HGB 12.0*  HCT 36.7*  PLT 216  APTT 27  LABPROT 12.9  INR 1.0  CREATININE 1.21  TROPONINIHS 9    Estimated Creatinine Clearance: 29 mL/min (by C-G formula based on SCr of 1.21 mg/dL).   Medical History: Past Medical History:  Diagnosis Date   H/O hernia repair 2017   Stroke [facility]) 1978    Assessment: Pt is 88 yo male arriving via EMS called out for N/V/D and generalized weakness, found w/ portal venous thrombosis in right lobe.  Goal of Therapy:  Heparin level 0.3-0.7 units/ml Monitor platelets by anticoagulation protocol: Yes   Plan:  Bolus 3300 units x 1 Start heparin infusion at 850 units/hr Check HL in 8 hrs after start of infusion CBC daily while on heparin  97, PharmD, [facility] 01/27/2022 7:24 AM

## 2022-01-27 NOTE — Progress Notes (Signed)
CODE SEPSIS - PHARMACY COMMUNICATION  **Broad Spectrum Antibiotics should be administered within 1 hour of Sepsis diagnosis**  Time Code Sepsis Called/Page Received: 0445  Antibiotics Ordered: Cefepime, Flagyl, Vancomycin  Time of 1st antibiotic administration: 0517  Otelia Sergeant, PharmD, [facility] 01/31/2022 4:50 AM

## 2022-01-27 NOTE — ED Notes (Addendum)
This RN to bedside for pt. Getting restless, and grabbing at EKG cords and moaning. This RN medicated pt. For pain and restlessness. Pt's linens wet, so this RN and Sean PCT cleaned pt, peri care performed, dry linens and brief applied. Fresh warm blankets provided. Pt. Resting comfortably. Will continue to monitor. Pt. Continues to have blow by O2 through [place] in front of mouth.

## 2022-01-28 DIAGNOSIS — J189 Pneumonia, unspecified organism: Secondary | ICD-10-CM | POA: Diagnosis not present

## 2022-01-28 DIAGNOSIS — E872 Acidosis, unspecified: Secondary | ICD-10-CM | POA: Diagnosis not present

## 2022-01-28 DIAGNOSIS — J9601 Acute respiratory failure with hypoxia: Secondary | ICD-10-CM | POA: Diagnosis not present

## 2022-01-28 LAB — BLOOD CULTURE ID PANEL (REFLEXED) - BCID2

## 2022-01-28 LAB — BLOOD GAS, VENOUS
Acid-base deficit: 0.2 mmol/L (ref 0.0–2.0)
Bicarbonate: 25.9 mmol/L (ref 20.0–28.0)
O2 Saturation: 27.1 %
Patient temperature: 37
pCO2, Ven: 47 mmHg (ref 44–60)
pH, Ven: 7.35 (ref 7.25–7.43)
pO2, Ven: <31 mmHg — CL (ref 32–45)

## 2022-01-28 LAB — URINE CULTURE: Culture: NO GROWTH

## 2022-01-29 DIAGNOSIS — E872 Acidosis, unspecified: Secondary | ICD-10-CM | POA: Diagnosis not present

## 2022-01-29 DIAGNOSIS — R6521 Severe sepsis with septic shock: Secondary | ICD-10-CM

## 2022-01-29 DIAGNOSIS — J189 Pneumonia, unspecified organism: Secondary | ICD-10-CM | POA: Diagnosis not present

## 2022-01-30 LAB — CULTURE, BLOOD (ROUTINE X 2): Special Requests: ADEQUATE

## 2022-02-01 LAB — CULTURE, BLOOD (ROUTINE X 2): Culture: NO GROWTH

## 2022-02-01 NOTE — Death Summary Note (Signed)
Death Summary  Alex Mclaughlin FBP:[id number] DOB: 04-24-1925 DOA: Feb 25, 2022  PCP: Jerl Mina, MD   Admit date: 25-Feb-2022 Date of Death: 02/27/2022  Final Diagnoses:  Principal Problem:   Comfort measures only status Active Problems:   Macrocytic anemia   Multifocal pneumonia   Nausea vomiting and diarrhea   Severe sepsis with septic shock (CODE) (HCC)   Hypokalemia   Portal vein thrombosis   Acute respiratory failure with hypoxia (HCC)   Tobacco use   Chronic diastolic CHF (congestive heart failure) (HCC)   Stroke (HCC)   Coma (HCC)   AKI (acute kidney injury) (HCC)   AAA (abdominal aortic aneurysm)  HPI was taken from Dr. Clyde Lundborg: Alex Mclaughlin is a 88 y.o. male with medical history significant of stroke, hernia repair, macrocytic anemia, cognitive decline, tobacco abuse, who presents with altered mental status, cough, shortness breath, nausea, vomiting, diarrhea.   Per his son at the bedside, at normal baseline, patient recognizes his son, is oriented to place, most of the time pt is oriented to time, but occasionally confused about time.  He was noted to be confused since last night.  He has cough with clear mucus production and shortness of breath.  Does not seem to have chest pain.  Patient was found to have oxygen desaturating to 80% on room air in ED. Patient also has nausea, multiple episodes of nonbilious nonbloody vomiting and diarrhea.  Not sure if patient has abdominal pain.  Patient has fever 102.5 in ED.    Patient was hypotensive with blood pressure 83/58, which transiently improved to 119/62 after giving 3 L LR bolus, then blood pressure decreased to SBP of 70s.  Patient is unresponsive, basically in comatose status when I saw pt in ED. He slightly moves his extremities on painful stimuli.  Patient was also found to have portal vein thrombosis.  IV heparin was started in the ED.   Data Reviewed and ED Course: pt was found to have WBC 7.8, lactic acid 4.3 --> 3.8,  INR 1.0, PTT 27, troponin level 9, BNP 80, lipase 23, urinalysis not impressive, negative COVID PCR, D-dimer 8.90, potassium 3.4, AKI with Cre 1.21 and BUN 20 (baseline creatinine 0.73 on 04/25/2021), normal liver function, temperature 102.5, heart rate 121, RR 29.  VBG with pH 7.35, CO2 47 and O2  <31. CT of head is negative for acute intracranial abnormalities.  CT angiogram of chest is negative for PE, but showed multifocal pneumonia.  CT of abdomen/pelvis showed AAA and portal venous thrombosis.  Patient is admitted to MedSurg bed for comfort care.   Chest CTA:  1. Generalized airspace disease consistent with multifocal pneumonia. 2. Negative for pulmonary embolism. 3. Aortic Atherosclerosis (ICD10-I70.0) and Emphysema (ICD10-J43.9).   CT-abd/pelvis 1. Intrahepatic portal venous thrombosis in the inferior right lobe. No definite underlying cause. 2. 5.3 cm fusiform abdominal aortic aneurysm. Recommend referral to a vascular specialist if appropriate for comorbidities. This recommendation follows ACR consensus guidelines: White Paper of the ACR Incidental Findings Committee II on Vascular Findings. J Am Coll Radiol 2013; 10:789-794. 3. Cholelithiasis and nephrolithiasis   EKG: I have personally reviewed.  Poor quality of EKG strip, sinus rhythm, LAD, poor R wave progression, anteroseptal infarction pattern   As per Dr. Mayford Knife 26-Feb-2022: Pt was made comfort care on admission for severe sepsis w/ septic shock due to multifocal pneumonia. Pt also had acute hypoxic respiratory failure. A discussion was had with the pt's son and agreed to make pt comfort care. Unfortunately,  pt passed away on 01/31/22 at March 23, 2120.   Hospital Course:   Severe septic shock: secondary to multifocal pneumonia. Continue w/ comfort care   Acute hypoxic respiratory failure: likely secondary to multifocal pneumonia. Comfort care only   Multifocal pneumonia: continue w/ comfort care   Lactic acidosis: comfort care  only   Macrocytic anemia: comfort care only    Hypokalemia: continue w/ comfort care   Time of death: 23-Mar-2120 Time: > 30 mins   Signed:  Charise Killian  [facility] 01/24/2022, 2:12 PM

## 2022-02-01 NOTE — Progress Notes (Addendum)
PHARMACY - PHYSICIAN COMMUNICATION CRITICAL VALUE ALERT - BLOOD CULTURE IDENTIFICATION (BCID)  Alex Mclaughlin is an 88 y.o. male who presented to [facility] on 2022-02-05 with a chief complaint of AMS, cough, SOB, and N/V/D.  Assessment:  1/4 bottles (aerobic) staph species, no ID, no resistance detected. Per chart review, pt is comfort care. Will notify MD as Lorain Childes.   Name of physician (or Provider) Contacted: Dr. Mayford Knife  Current antibiotics: none - pt is comfort care  Changes to prescribed antibiotics recommended:  Recommendations accepted by provider  Results for orders placed or performed during the hospital encounter of 02-05-2022  Blood Culture ID Panel (Reflexed) (Collected: Feb 05, 2022  4:51 AM)  Result Value Ref Range   Enterococcus faecalis NOT DETECTED NOT DETECTED   Enterococcus Faecium NOT DETECTED NOT DETECTED   Listeria monocytogenes NOT DETECTED NOT DETECTED   Staphylococcus species DETECTED (A) NOT DETECTED   Staphylococcus aureus (BCID) NOT DETECTED NOT DETECTED   Staphylococcus epidermidis NOT DETECTED NOT DETECTED   Staphylococcus lugdunensis NOT DETECTED NOT DETECTED   Streptococcus species NOT DETECTED NOT DETECTED   Streptococcus agalactiae NOT DETECTED NOT DETECTED   Streptococcus pneumoniae NOT DETECTED NOT DETECTED   Streptococcus pyogenes NOT DETECTED NOT DETECTED   A.calcoaceticus-baumannii NOT DETECTED NOT DETECTED   Bacteroides fragilis NOT DETECTED NOT DETECTED   Enterobacterales NOT DETECTED NOT DETECTED   Enterobacter cloacae complex NOT DETECTED NOT DETECTED   Escherichia coli NOT DETECTED NOT DETECTED   Klebsiella aerogenes NOT DETECTED NOT DETECTED   Klebsiella oxytoca NOT DETECTED NOT DETECTED   Klebsiella pneumoniae NOT DETECTED NOT DETECTED   Proteus species NOT DETECTED NOT DETECTED   Salmonella species NOT DETECTED NOT DETECTED   Serratia marcescens NOT DETECTED NOT DETECTED   Haemophilus influenzae NOT DETECTED NOT DETECTED   Neisseria  meningitidis NOT DETECTED NOT DETECTED   Pseudomonas aeruginosa NOT DETECTED NOT DETECTED   Stenotrophomonas maltophilia NOT DETECTED NOT DETECTED   Candida albicans NOT DETECTED NOT DETECTED   Candida auris NOT DETECTED NOT DETECTED   Candida glabrata NOT DETECTED NOT DETECTED   Candida krusei NOT DETECTED NOT DETECTED   Candida parapsilosis NOT DETECTED NOT DETECTED   Candida tropicalis NOT DETECTED NOT DETECTED   Cryptococcus neoformans/gattii NOT DETECTED NOT DETECTED    Alex Mclaughlin 01/27/2022  11:18 AM

## 2022-02-01 NOTE — Progress Notes (Signed)
PROGRESS NOTE    Alex Mclaughlin  [id number] DOB: 1925-07-16 DOA: 01/31/2022 PCP: Maryland Pink, MD   Assessment & Plan:   Principal Problem:   Comfort measures only status Active Problems:   Macrocytic anemia   Multifocal pneumonia   Nausea vomiting and diarrhea   Severe sepsis with septic shock (CODE) (HCC)   Hypokalemia   Portal vein thrombosis   Acute respiratory failure with hypoxia (HCC)   Tobacco use   Chronic diastolic CHF (congestive heart failure) ([place])   Stroke ([place])   Coma ([place])   AKI (acute kidney injury) ([place])   AAA (abdominal aortic aneurysm)   Severe septic shock: secondary to multifocal pneumonia. Continue w/ comfort care  Acute hypoxic respiratory failure: likely secondary to multifocal pneumonia. Comfort care only  Multifocal pneumonia: continue w/ comfort care  Lactic acidosis: comfort care only  Macrocytic anemia: comfort care only   Hypokalemia: continue w/ comfort care    DVT prophylaxis: comfort care only  Code Status: DNR/DNI Family Communication: discussed pt's care w/ pt's son, [name] and answered his questions Disposition Plan: possible in hospital death   Level of care: Med-Surg  Status is: Inpatient Remains inpatient appropriate because: likely in hospital death       Consultants:    Procedures:   Antimicrobials:  Subjective: Pt is obtunded   Objective: Vitals:   01/11/2022 1500 01/30/2022 1515 01/16/2022 1530 01/21/2022 1653  BP: 104/69 (!) 93/40 (!) 86/49 (!) 98/55  Pulse:    (!) 101  Resp:    (!) 22  Temp:    98 F (36.7 C)  TempSrc:    Oral  SpO2:    (!) 83%  Weight:      Height:        Intake/Output Summary (Last 24 hours) at 02/05/22 0740 Last data filed at February 05, 2022 0339 Gross per 24 hour  Intake 17.82 ml  Output --  Net 17.82 ml   Filed Weights   01/17/2022 0713  Weight: 55 kg    Examination:  General exam: Appears obtunded but comfortable   Respiratory system: decreased breath sounds b/l   Cardiovascular system: S1 & S2 +. No rubs, gallops or clicks.  Gastrointestinal system: Abdomen is nondistended, soft and nontender. Hypoactive bowel sounds heard. Central nervous system: obtunded  Psychiatry: unable to assess as pt is obtunded      Data Reviewed: I have personally reviewed following labs and imaging studies  CBC: Recent Labs  Lab 01/15/2022 0451  WBC 7.8  NEUTROABS 7.1  HGB 12.0*  HCT 36.7*  MCV 101.1*  PLT [id number]   Basic Metabolic Panel: Recent Labs  Lab 01/25/2022 0451  NA 139  K 3.4*  CL 103  CO2 24  GLUCOSE 179*  BUN 20  CREATININE 1.21  CALCIUM 8.3*   GFR: Estimated Creatinine Clearance: 29 mL/min (by C-G formula based on SCr of 1.21 mg/dL). Liver Function Tests: Recent Labs  Lab 01/08/2022 0451  AST 25  ALT 11  ALKPHOS 88  BILITOT 0.6  PROT 6.3*  ALBUMIN 3.0*   Recent Labs  Lab 01/07/2022 0451  LIPASE 23   No results for input(s): AMMONIA in the last 168 hours. Coagulation Profile: Recent Labs  Lab 01/27/2022 0451  INR 1.0   Cardiac Enzymes: No results for input(s): CKTOTAL, CKMB, CKMBINDEX, TROPONINI in the last 168 hours. BNP (last 3 results) No results for input(s): PROBNP in the last 8760 hours. HbA1C: No results for input(s): HGBA1C in the last 72 hours. CBG: No  results for input(s): GLUCAP in the last 168 hours. Lipid Profile: No results for input(s): CHOL, HDL, LDLCALC, TRIG, CHOLHDL, LDLDIRECT in the last 72 hours. Thyroid Function Tests: No results for input(s): TSH, T4TOTAL, FREET4, T3FREE, THYROIDAB in the last 72 hours. Anemia Panel: No results for input(s): VITAMINB12, FOLATE, FERRITIN, TIBC, IRON, RETICCTPCT in the last 72 hours. Sepsis Labs: Recent Labs  Lab 01/31/22 0451 January 31, 2022 0630  LATICACIDVEN 4.3* 3.8*    Recent Results (from the past 240 hour(s))  Resp Panel by RT-PCR (Flu A&B, Covid) Nasopharyngeal Swab     Status: None   Collection Time: 2022-01-31  4:51 AM   Specimen: Nasopharyngeal Swab;  Nasopharyngeal(NP) swabs in vial transport medium  Result Value Ref Range Status   SARS Coronavirus 2 by RT PCR NEGATIVE NEGATIVE Final    Comment: (NOTE) SARS-CoV-2 target nucleic acids are NOT DETECTED.  The SARS-CoV-2 RNA is generally detectable in upper respiratory specimens during the acute phase of infection. The lowest concentration of SARS-CoV-2 viral copies this assay can detect is 138 copies/mL. A negative result does not preclude SARS-Cov-2 infection and should not be used as the sole basis for treatment or other patient management decisions. A negative result may occur with  improper specimen collection/handling, submission of specimen other than nasopharyngeal swab, presence of viral mutation(s) within the areas targeted by this assay, and inadequate number of viral copies(<138 copies/mL). A negative result must be combined with clinical observations, patient history, and epidemiological information. The expected result is Negative.  Fact Sheet for Patients:  BloggerCourse.com  Fact Sheet for Healthcare Providers:  SeriousBroker.it  This test is no t yet approved or cleared by the [place] FDA and  has been authorized for detection and/or diagnosis of SARS-CoV-2 by FDA under an Emergency Use Authorization (EUA). This EUA will remain  in effect (meaning this test can be used) for the duration of the COVID-19 declaration under Section 564(b)(1) of the Act, 21 U.S.C.section 360bbb-3(b)(1), unless the authorization is terminated  or revoked sooner.       Influenza A by PCR NEGATIVE NEGATIVE Final   Influenza B by PCR NEGATIVE NEGATIVE Final    Comment: (NOTE) The Xpert Xpress SARS-CoV-2/FLU/RSV plus assay is intended as an aid in the diagnosis of influenza from Nasopharyngeal swab specimens and should not be used as a sole basis for treatment. Nasal washings and aspirates are unacceptable for Xpert Xpress  SARS-CoV-2/FLU/RSV testing.  Fact Sheet for Patients: BloggerCourse.com  Fact Sheet for Healthcare Providers: SeriousBroker.it  This test is not yet approved or cleared by the [place] FDA and has been authorized for detection and/or diagnosis of SARS-CoV-2 by FDA under an Emergency Use Authorization (EUA). This EUA will remain in effect (meaning this test can be used) for the duration of the COVID-19 declaration under Section 564(b)(1) of the Act, 21 U.S.C. section 360bbb-3(b)(1), unless the authorization is terminated or revoked.  Performed at [facility], [address] [place], [place] [address]   Blood Culture (routine x 2)     Status: None (Preliminary result)   Collection Time: Jan 31, 2022  4:51 AM   Specimen: BLOOD  Result Value Ref Range Status   Specimen Description BLOOD LEFT [facility]  Final   Special Requests   Final    BOTTLES DRAWN AEROBIC AND ANAEROBIC Blood Culture adequate volume   Culture   Final    NO GROWTH <12 HOURS Performed at [facility], [address], [place], [place] [address]    Report Status  PENDING  Incomplete  Blood Culture (routine x 2)     Status: None (Preliminary result)   Collection Time: 01/17/2022  4:51 AM   Specimen: BLOOD  Result Value Ref Range Status   Specimen Description BLOOD LEFT FA  Final   Special Requests   Final    BOTTLES DRAWN AEROBIC AND ANAEROBIC Blood Culture results may not be optimal due to an excessive volume of blood received in culture bottles   Culture   Final    NO GROWTH <12 HOURS Performed at [facility], [address], [place], [place] [address]    Report Status PENDING  Incomplete         Radiology Studies: CT HEAD WO CONTRAST (5MM)  Result Date: 01/22/2022 CLINICAL DATA:  88 year old male with history of altered mental status. Nausea, vomiting, diarrhea and generalized weakness. EXAM: CT HEAD WITHOUT CONTRAST TECHNIQUE:  Contiguous axial images were obtained from the base of the skull through the vertex without intravenous contrast. RADIATION DOSE REDUCTION: This exam was performed according to the departmental dose-optimization program which includes automated exposure control, adjustment of the mA and/or kV according to patient size and/or use of iterative reconstruction technique. COMPARISON:  Head CT 04/20/2021. FINDINGS: Brain: Moderate cerebral and cerebellar atrophy. Patchy and confluent areas of decreased attenuation are noted throughout the deep and periventricular white matter of the cerebral hemispheres bilaterally, compatible with chronic microvascular ischemic disease. Small well-defined areas of low attenuation in the cerebellar hemispheres bilaterally, indicative of old lacunar infarcts. A small lacunar infarct is also noted in the right caudate nucleus as well. Large area of low attenuation and volume loss in the right temporal and parietal regions, compatible with encephalomalacia from remote right MCA territory infarct, similar to the prior examination. No evidence of acute infarction, hemorrhage, hydrocephalus, extra-axial collection or mass lesion/mass effect. Vascular: Numerous atherosclerotic calcifications are noted throughout the cerebral vasculature. Skull: Normal. Negative for fracture or focal lesion. Sinuses/Orbits: No acute finding. Other: None. IMPRESSION: 1. No acute intracranial abnormalities. 2. Moderate cerebral and cerebellar atrophy with extensive chronic microvascular ischemic changes in the cerebral white matter, old basal ganglia and bilateral cerebellar lacunar infarcts, and old right MCA territory infarct, as above. Electronically Signed   By: Vinnie Langton M.D.   On: 01/20/2022 06:19   CT Angio Chest PE W and/or Wo Contrast  Result Date: 01/08/2022 CLINICAL DATA:  Nausea, vomiting, and diarrhea with generalized weakness. Sepsis. EXAM: CT ANGIOGRAPHY CHEST CT ABDOMEN AND PELVIS WITH  CONTRAST TECHNIQUE: Multidetector CT imaging of the chest was performed using the standard protocol during bolus administration of intravenous contrast. Multiplanar CT image reconstructions and MIPs were obtained to evaluate the vascular anatomy. Multidetector CT imaging of the abdomen and pelvis was performed using the standard protocol during bolus administration of intravenous contrast. RADIATION DOSE REDUCTION: This exam was performed according to the departmental dose-optimization program which includes automated exposure control, adjustment of the mA and/or kV according to patient size and/or use of iterative reconstruction technique. CONTRAST:  23mL OMNIPAQUE IOHEXOL 350 MG/ML SOLN COMPARISON:  Abdomen and pelvis CT 08/20/2013 FINDINGS: CTA CHEST FINDINGS Cardiovascular: Satisfactory opacification of the pulmonary arteries to the segmental level. No evidence of pulmonary embolism when allowing for levels of motion artifact. Normal heart size. No pericardial effusion. Aortic and coronary atherosclerosis. Mediastinum/Nodes: Enlarged subcarinal lymph nodes measuring up to 15 mm, likely reactive to the pulmonary findings Lungs/Pleura: Patchy bilateral pulmonary infiltrate. Centrilobular emphysema. Heavily, coarsely calcified nodule in the lingula with benign appearance, up  to 2.5 cm. Musculoskeletal: Remote T8 and T9 compression fractures. Review of the MIP images confirms the above findings. CT ABDOMEN and PELVIS FINDINGS Hepatobiliary: No focal liver abnormality.Nonenhancing portal veins in the inferior segment right liver. No visible associated mass. Cholelithiasis but no gallbladder over distension or suspected acute cholecystitis. Pancreas: Generalized atrophy Spleen: Unremarkable. Adrenals/Urinary Tract: Negative adrenals. No hydronephrosis or ureteral stone. Two left renal calculi measuring up to 9 mm at the lower pole. Punctate right interpolar renal calculus. Unremarkable, collapsed bladder.  Stomach/Bowel: No obstruction. No visible bowel inflammation. Mild colonic diverticulosis. Vascular/Lymphatic: No acute vascular abnormality. Fusiform abdominal aortic aneurysm with mural thrombus. Dimensions measure up to 5.3 cm. Calcified plaque at the visceral ostia with greatest narrowing at the celiac origin. Major vessels are enhancing. No mass or adenopathy. Reproductive:Symmetric prostate enlargement, incidental. Other: No ascites or pneumoperitoneum.  Fatty left groin hernia. Musculoskeletal: No acute abnormalities. Generalized lumbar spine degeneration with L4-5 anterolisthesis. Review of the MIP images confirms the above findings. IMPRESSION: Chest CTA: 1. Generalized airspace disease consistent with multifocal pneumonia. 2. Negative for pulmonary embolism. 3. Aortic Atherosclerosis (ICD10-I70.0) and Emphysema (ICD10-J43.9). Abdominal CT: 1. Intrahepatic portal venous thrombosis in the inferior right lobe. No definite underlying cause. 2. 5.3 cm fusiform abdominal aortic aneurysm. Recommend referral to a vascular specialist if appropriate for comorbidities. This recommendation follows ACR consensus guidelines: White Paper of the ACR Incidental Findings Committee II on Vascular Findings. J Am Coll Radiol 2013; 10:789-794. 3. Cholelithiasis and nephrolithiasis Electronically Signed   By: Jorje Guild M.D.   On: 01/17/2022 06:33   CT ABDOMEN PELVIS W CONTRAST  Result Date: 01/31/2022 CLINICAL DATA:  Nausea, vomiting, and diarrhea with generalized weakness. Sepsis. EXAM: CT ANGIOGRAPHY CHEST CT ABDOMEN AND PELVIS WITH CONTRAST TECHNIQUE: Multidetector CT imaging of the chest was performed using the standard protocol during bolus administration of intravenous contrast. Multiplanar CT image reconstructions and MIPs were obtained to evaluate the vascular anatomy. Multidetector CT imaging of the abdomen and pelvis was performed using the standard protocol during bolus administration of intravenous  contrast. RADIATION DOSE REDUCTION: This exam was performed according to the departmental dose-optimization program which includes automated exposure control, adjustment of the mA and/or kV according to patient size and/or use of iterative reconstruction technique. CONTRAST:  2mL OMNIPAQUE IOHEXOL 350 MG/ML SOLN COMPARISON:  Abdomen and pelvis CT 08/20/2013 FINDINGS: CTA CHEST FINDINGS Cardiovascular: Satisfactory opacification of the pulmonary arteries to the segmental level. No evidence of pulmonary embolism when allowing for levels of motion artifact. Normal heart size. No pericardial effusion. Aortic and coronary atherosclerosis. Mediastinum/Nodes: Enlarged subcarinal lymph nodes measuring up to 15 mm, likely reactive to the pulmonary findings Lungs/Pleura: Patchy bilateral pulmonary infiltrate. Centrilobular emphysema. Heavily, coarsely calcified nodule in the lingula with benign appearance, up to 2.5 cm. Musculoskeletal: Remote T8 and T9 compression fractures. Review of the MIP images confirms the above findings. CT ABDOMEN and PELVIS FINDINGS Hepatobiliary: No focal liver abnormality.Nonenhancing portal veins in the inferior segment right liver. No visible associated mass. Cholelithiasis but no gallbladder over distension or suspected acute cholecystitis. Pancreas: Generalized atrophy Spleen: Unremarkable. Adrenals/Urinary Tract: Negative adrenals. No hydronephrosis or ureteral stone. Two left renal calculi measuring up to 9 mm at the lower pole. Punctate right interpolar renal calculus. Unremarkable, collapsed bladder. Stomach/Bowel: No obstruction. No visible bowel inflammation. Mild colonic diverticulosis. Vascular/Lymphatic: No acute vascular abnormality. Fusiform abdominal aortic aneurysm with mural thrombus. Dimensions measure up to 5.3 cm. Calcified plaque at the visceral ostia with greatest narrowing at the celiac origin.  Major vessels are enhancing. No mass or adenopathy. Reproductive:Symmetric  prostate enlargement, incidental. Other: No ascites or pneumoperitoneum.  Fatty left groin hernia. Musculoskeletal: No acute abnormalities. Generalized lumbar spine degeneration with L4-5 anterolisthesis. Review of the MIP images confirms the above findings. IMPRESSION: Chest CTA: 1. Generalized airspace disease consistent with multifocal pneumonia. 2. Negative for pulmonary embolism. 3. Aortic Atherosclerosis (ICD10-I70.0) and Emphysema (ICD10-J43.9). Abdominal CT: 1. Intrahepatic portal venous thrombosis in the inferior right lobe. No definite underlying cause. 2. 5.3 cm fusiform abdominal aortic aneurysm. Recommend referral to a vascular specialist if appropriate for comorbidities. This recommendation follows ACR consensus guidelines: White Paper of the ACR Incidental Findings Committee II on Vascular Findings. J Am Coll Radiol 2013; 10:789-794. 3. Cholelithiasis and nephrolithiasis Electronically Signed   By: Jorje Guild M.D.   On: 01/13/2022 06:33   DG Chest Port 1 View  Result Date: 01/10/2022 CLINICAL DATA:  88 year old male with possible sepsis. Weakness and nausea. EXAM: PORTABLE CHEST 1 VIEW COMPARISON:  Chest x-ray 04/20/2021. FINDINGS: Lung volumes are low. Again noted are widespread areas of interstitial prominence and patchy ill-defined opacities scattered throughout the lungs bilaterally, most pronounced in the mid to lower lungs, very similar to prior study from 2022, favored to reflect chronic pulmonary fibrosis. Large calcified granuloma in the inferior aspect of the lingula. No pleural effusions. No pneumothorax. No evidence of pulmonary edema. Heart size is normal. Upper mediastinal contours are within normal limits. Atherosclerotic calcifications in the thoracic aorta. IMPRESSION: 1. The appearance of the chest is strongly favored to reflect chronic interstitial lung disease, as above. The possibility of superimposed acute infection is not entirely excluded, but not strongly favored.  Further evaluation with high-resolution chest CT is recommended at this time. 2. Aortic atherosclerosis. Electronically Signed   By: Vinnie Langton M.D.   On: 01/22/2022 05:16        Scheduled Meds:  chlorhexidine  15 mL Mouth Rinse BID   mouth rinse  15 mL Mouth Rinse q12n4p   scopolamine  1 patch Transdermal Q72H   Continuous Infusions:  morphine 3 mg/hr (February 21, 2022 0339)     LOS: 1 day    Time spent: 25 mins     Wyvonnia Dusky, MD [facility] Pager [phone]  If 7PM-7AM, please contact night-coverage 2022/02/21, 7:40 AM

## 2022-02-01 DEATH — deceased

## 2022-07-02 IMAGING — CR DG HIP (WITH OR WITHOUT PELVIS) 2-3V*L*
1 series · 2 of 2 positions shown · non-contrast
Comparison: None.

CLINICAL DATA: Bilateral hip pain and weakness, initial encounter

EXAM:
DG HIP (WITH OR WITHOUT PELVIS) 2V LEFT

[Series 1: dg hip unilat w or w/o pelvis 2-3 views  · non-contrast · 0.14mm/px · 2 of 2 slices shown]
[im 1/2]
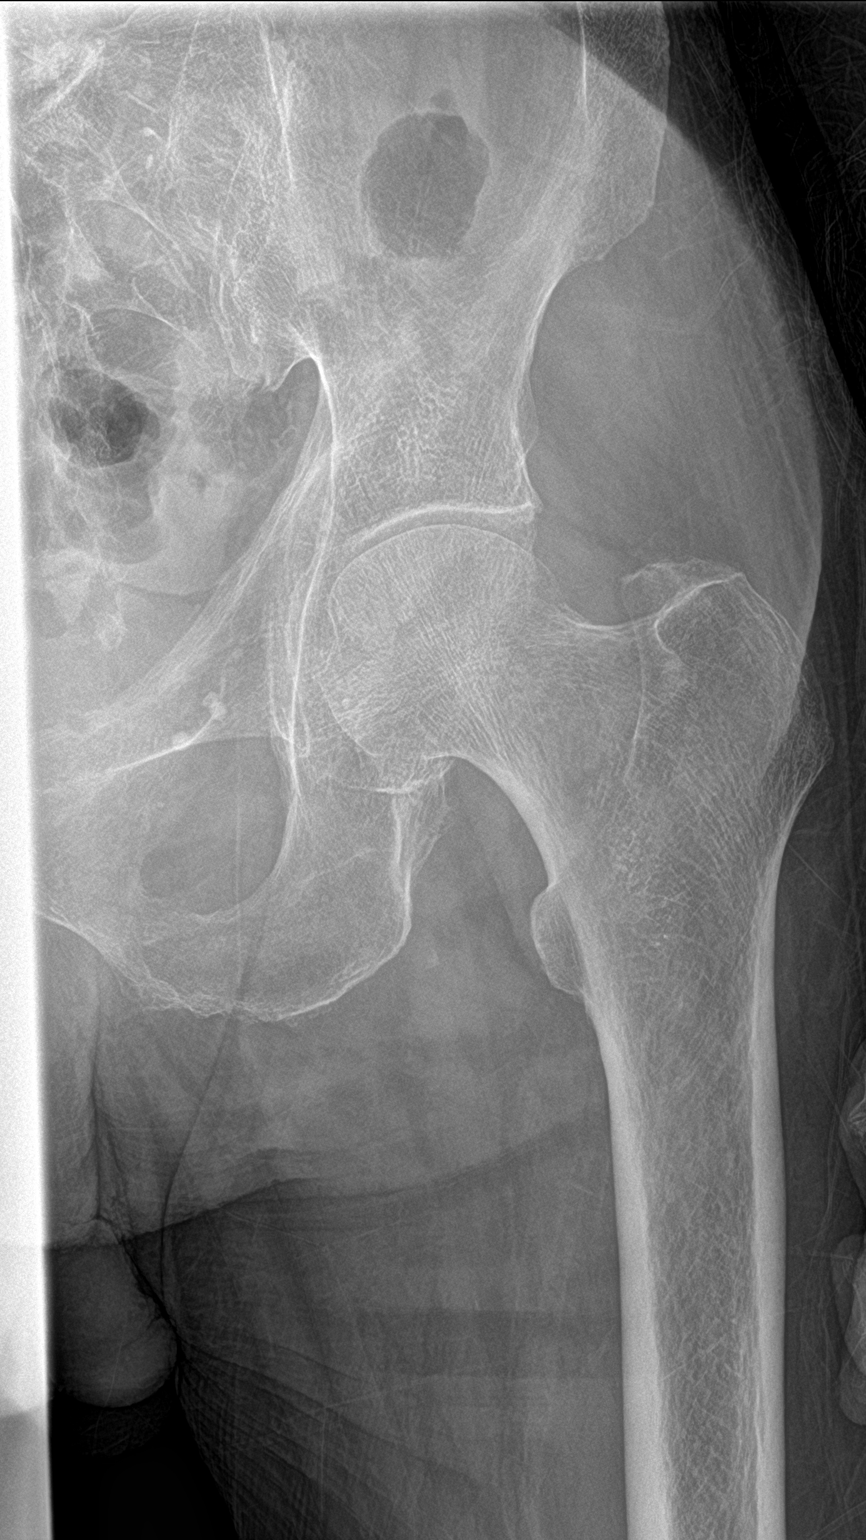
[im 2/2]
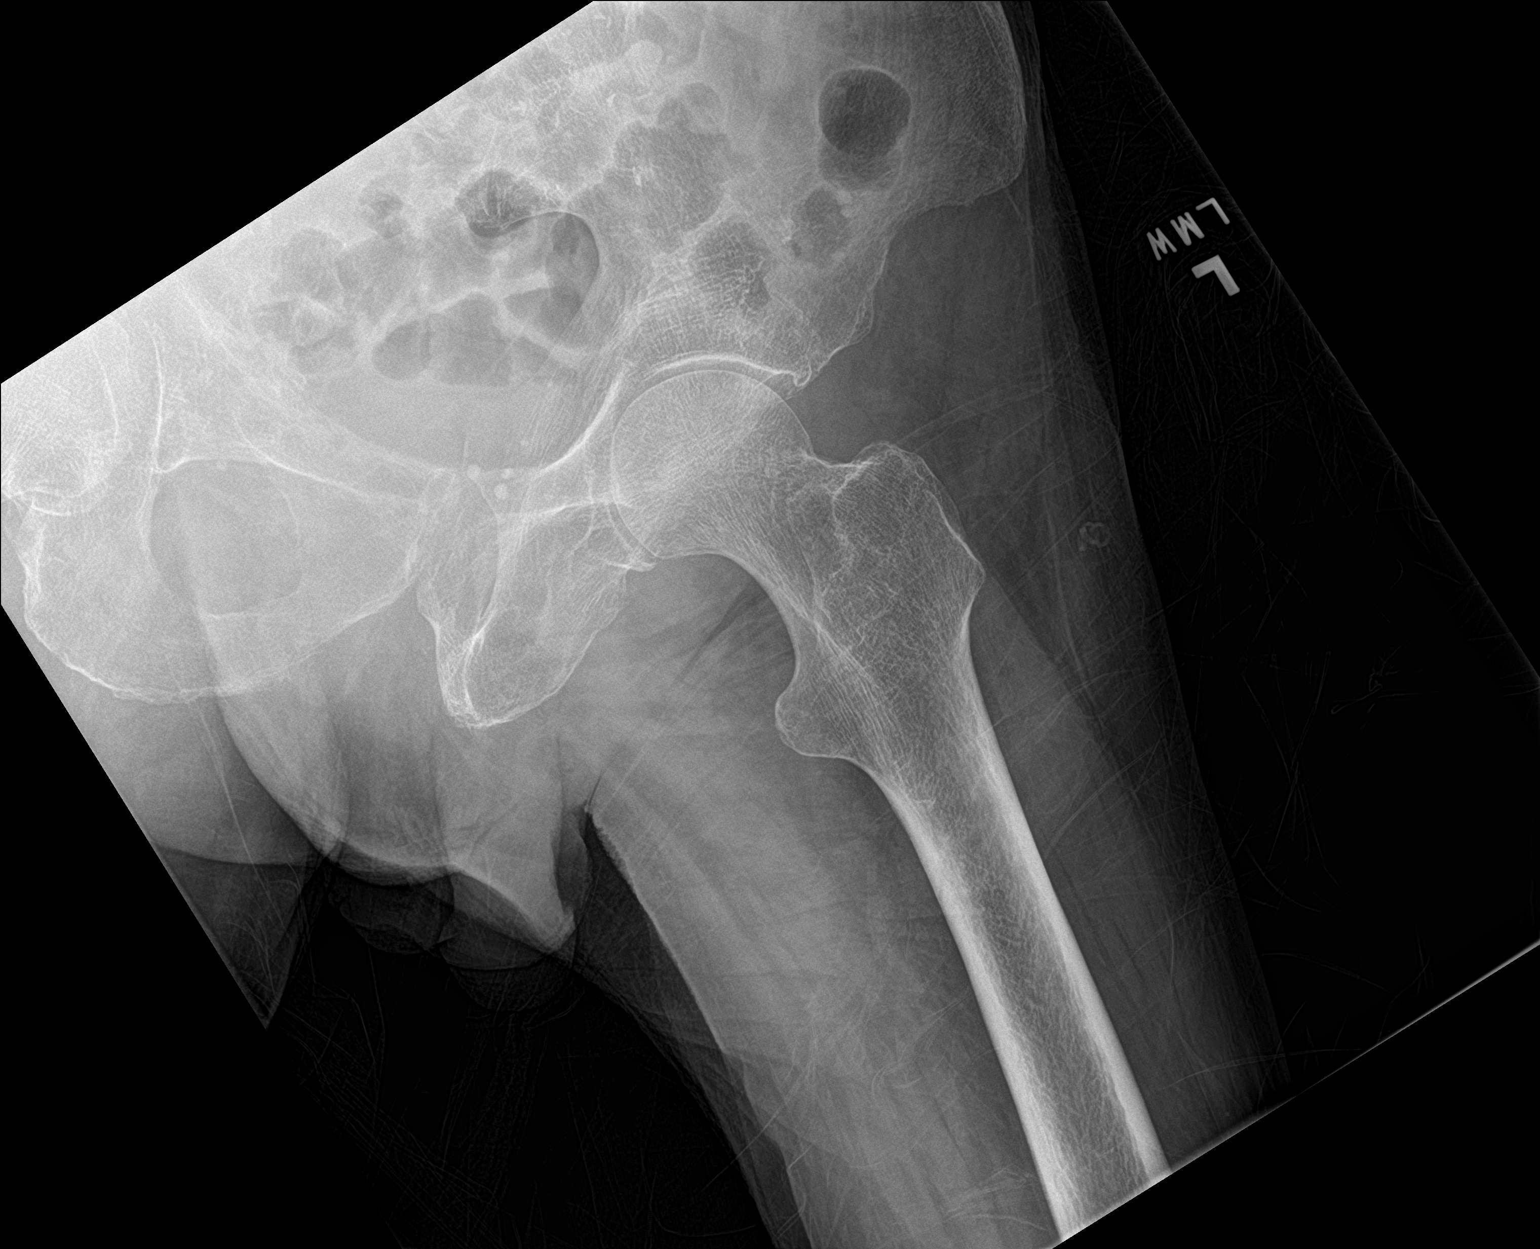

[2 of 2 positions shown; findings below may reference images not displayed]

FINDINGS: There is no evidence of hip fracture or dislocation. There is no
evidence of arthropathy or other focal bone abnormality.
IMPRESSION: No acute abnormality noted.

## 2022-07-02 IMAGING — CT CT HEAD W/O CM
3 series · 15 of 47 positions shown, 18 images · non-contrast
Comparison: None.

CLINICAL DATA: Fall

EXAM:
CT HEAD WITHOUT CONTRAST
TECHNIQUE: Contiguous axial images were obtained from the base of the skull
through the vertex without intravenous contrast.

[Series 2: head wo · axial · 0.40mm/px · z∈[-133,-8]mm · 9 of 31 slices shown, 12 images]
[im 3/31  brain]
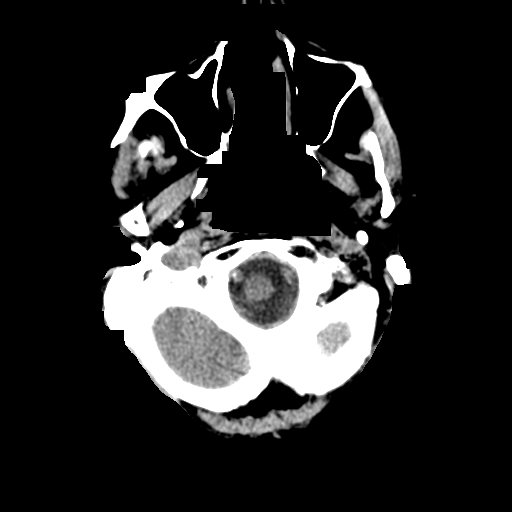
[im 3/31  bone]
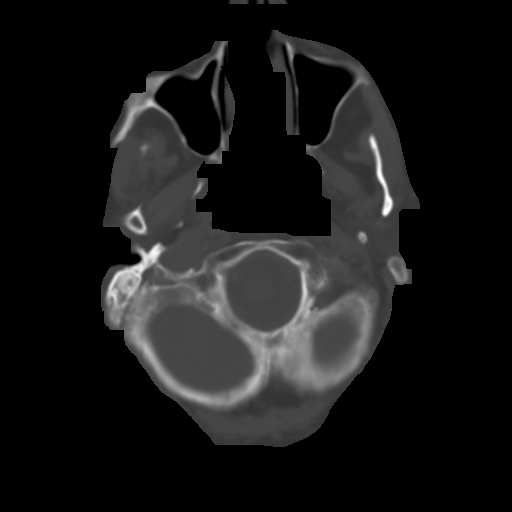
[im 6/31  brain]
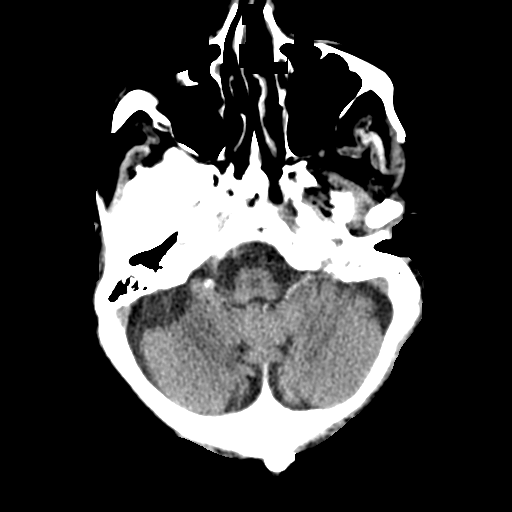
[im 9/31  brain]
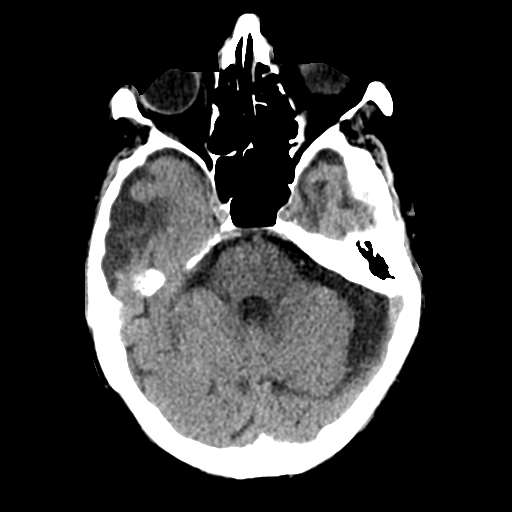
[im 12/31  brain]
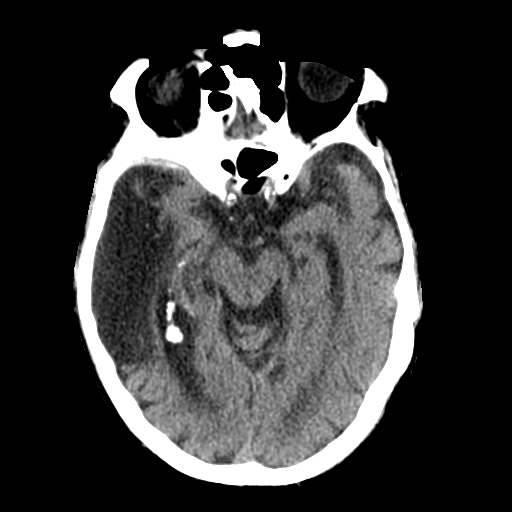
[im 16/31  brain]
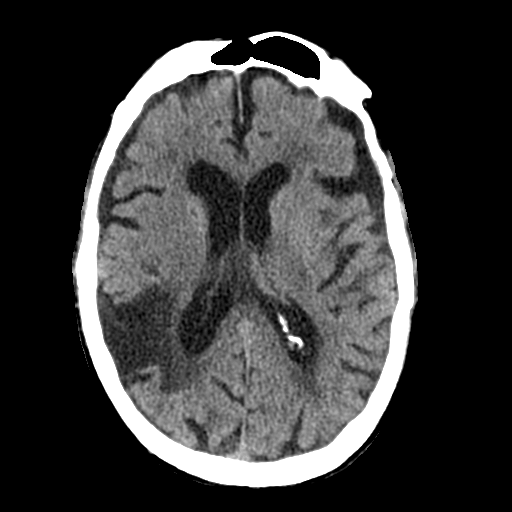
[im 16/31  bone]
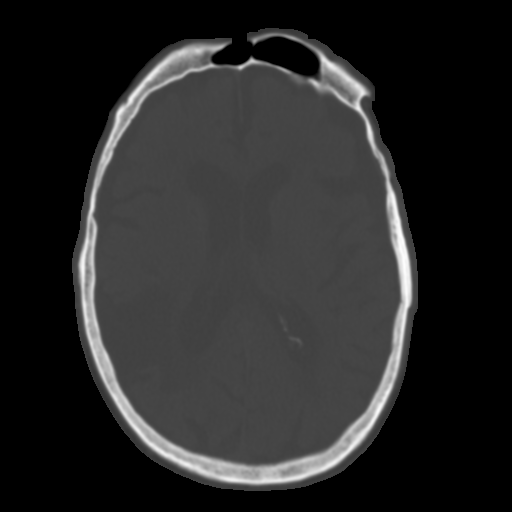
[im 19/31  brain]
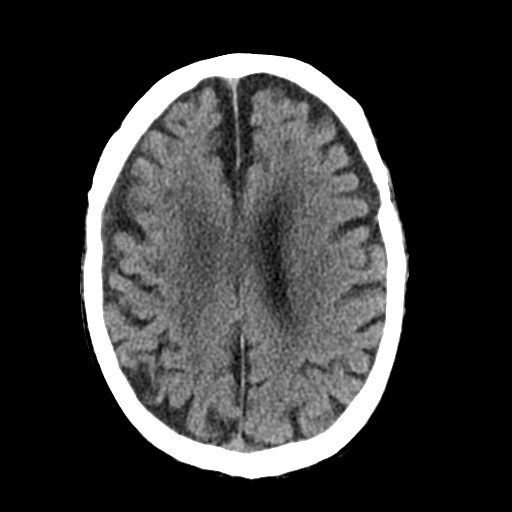
[im 22/31  brain]
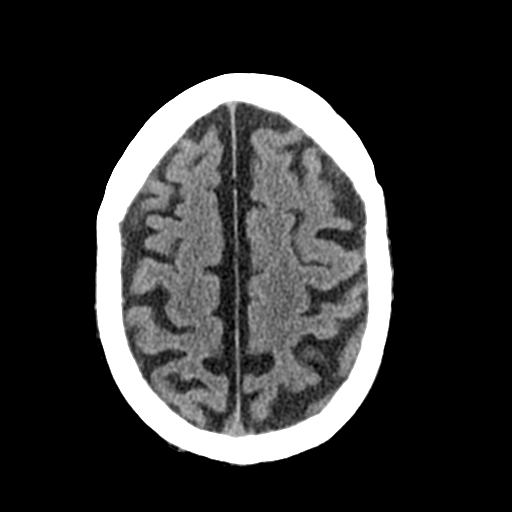
[im 25/31  brain]
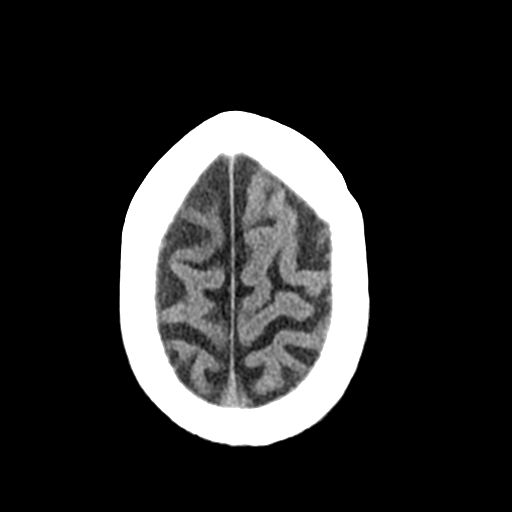
[im 28/31  brain]
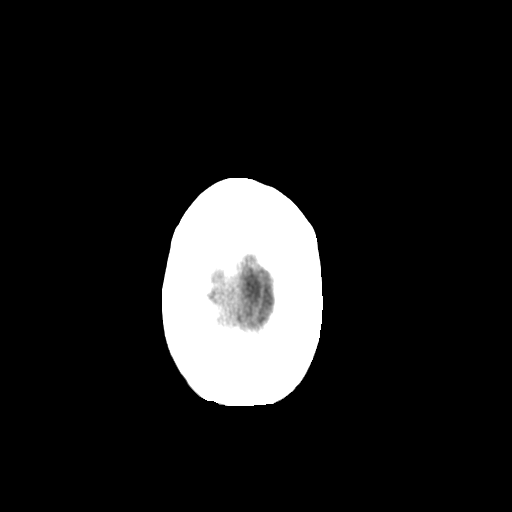
[im 28/31  bone]
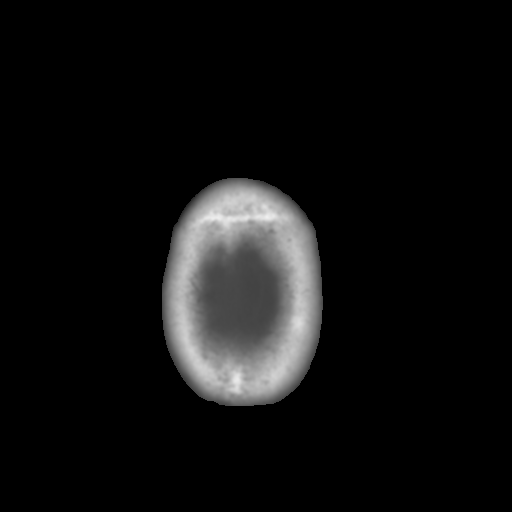

[Series 4: coronal soft tissue · coronal · 0.32mm/px · 3 of 65 slices shown]
[im 22/65  brain]
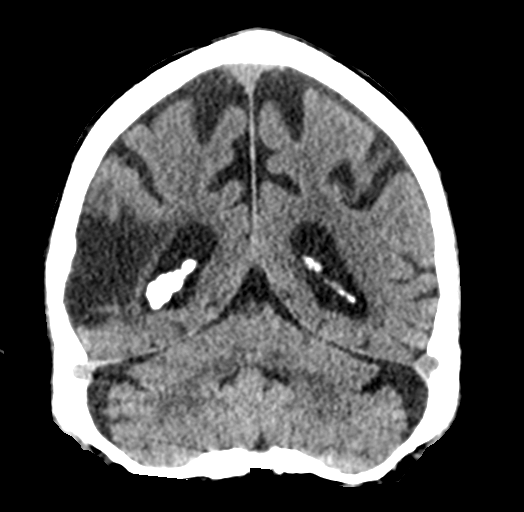
[im 29/65  brain]
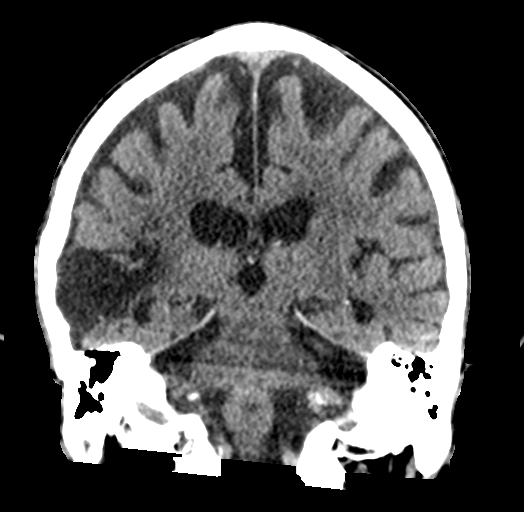
[im 36/65  brain]
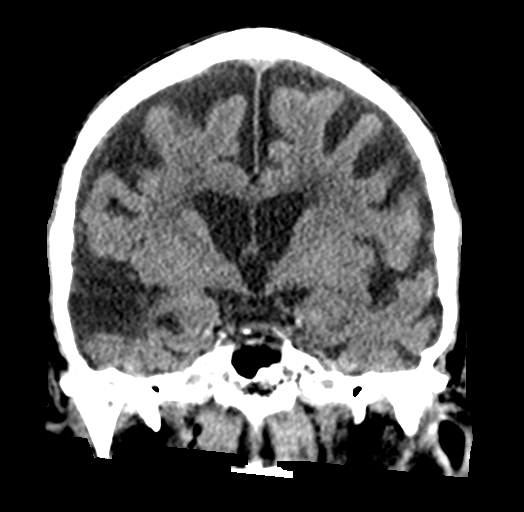

[Series 5: sagittal soft tissue · sagittal · 0.30mm/px · 3 of 51 slices shown]
[im 17/51  brain]
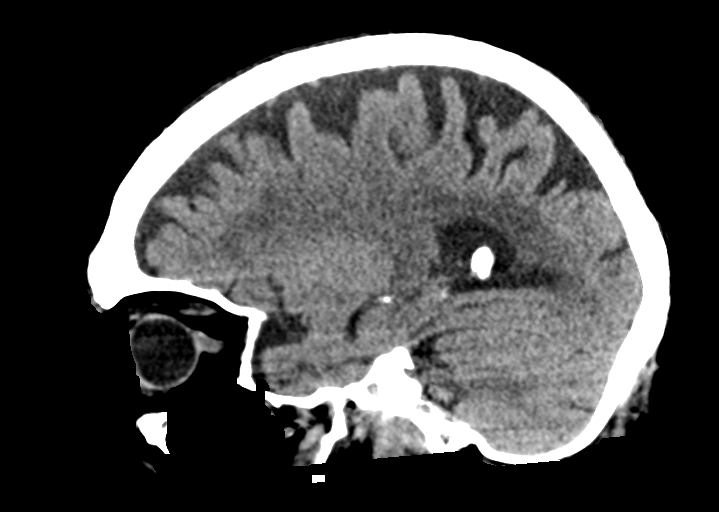
[im 26/51  brain]
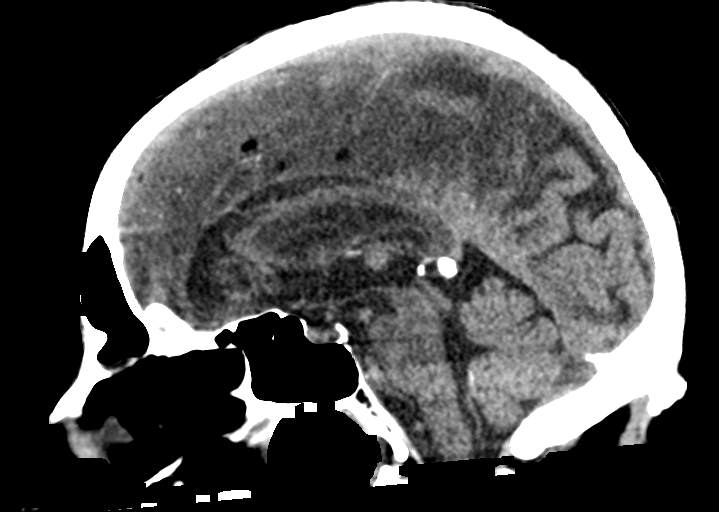
[im 34/51  brain]
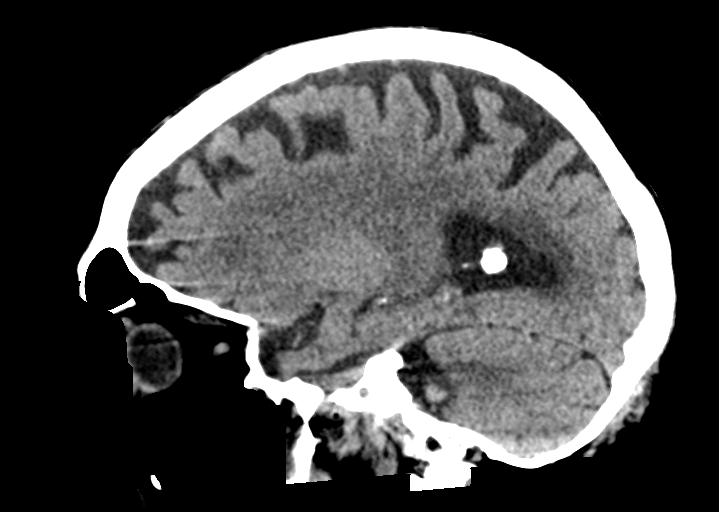

[15 of 47 positions shown; findings below may reference images not displayed]

FINDINGS: Brain: Right temporal and parietal encephalomalacia is unchanged.
Multiple old small vessel infarcts. Generalized atrophy. There is
periventricular hypoattenuation compatible with chronic
microvascular disease. No acute hemorrhage.

Vascular: No abnormal hyperdensity of the major intracranial
arteries or dural venous sinuses. No intracranial atherosclerosis.

Skull: The visualized skull base, calvarium and extracranial soft
tissues are normal.

Sinuses/Orbits: No fluid levels or advanced mucosal thickening of
the visualized paranasal sinuses. No mastoid or middle ear effusion.
The orbits are normal.
IMPRESSION: 1. No acute intracranial abnormality.
2. Unchanged right temporal and parietal encephalomalacia and
multiple old small vessel infarcts.
